# Patient Record
Sex: Female | Born: 1995 | Hispanic: Yes | Marital: Single | Smoking: Current every day smoker
Health system: Southern US, Community
[De-identification: ages and names within clinical notes are randomized; demographics above are authoritative.]

## PROBLEM LIST (undated history)

## (undated) DIAGNOSIS — F1010 Alcohol abuse, uncomplicated: Secondary | ICD-10-CM

---
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
## 2016-02-06 ENCOUNTER — Emergency Department (HOSPITAL_BASED_OUTPATIENT_CLINIC_OR_DEPARTMENT_OTHER): Payer: Self-pay

## 2016-02-06 ENCOUNTER — Emergency Department (HOSPITAL_BASED_OUTPATIENT_CLINIC_OR_DEPARTMENT_OTHER)
Admission: EM | Admit: 2016-02-06 | Discharge: 2016-02-06 | Disposition: A | Discharge status: Home / Self Care | Payer: Self-pay | Attending: Emergency Medicine | Admitting: Emergency Medicine

## 2016-02-06 ENCOUNTER — Encounter (HOSPITAL_BASED_OUTPATIENT_CLINIC_OR_DEPARTMENT_OTHER): Payer: Self-pay

## 2016-02-06 DIAGNOSIS — J209 Acute bronchitis, unspecified: Secondary | ICD-10-CM | POA: Insufficient documentation

## 2016-02-06 DIAGNOSIS — F17210 Nicotine dependence, cigarettes, uncomplicated: Secondary | ICD-10-CM | POA: Insufficient documentation

## 2016-02-06 DIAGNOSIS — J40 Bronchitis, not specified as acute or chronic: Secondary | ICD-10-CM

## 2016-02-06 LAB — RAPID STREP SCREEN (MED CTR MEBANE ONLY): STREPTOCOCCUS, GROUP A SCREEN (DIRECT): NEGATIVE

## 2016-02-06 MED ORDER — BENZONATATE 100 MG PO CAPS: 100.0000 mg | ORAL_CAPSULE | Freq: Three times a day (TID) | ORAL | Status: DC | PRN

## 2016-02-06 MED ORDER — AZITHROMYCIN 250 MG PO TABS: 250.0000 mg | ORAL_TABLET | Freq: Every day | ORAL | Status: DC

## 2016-02-06 MED ORDER — IBUPROFEN 800 MG PO TABS
800.0000 mg | ORAL_TABLET | Freq: Once | ORAL | Status: AC
  Administered 2016-02-06: 800 mg via ORAL
  Filled 2016-02-06: qty 1

## 2016-02-06 MED ORDER — IBUPROFEN 800 MG PO TABS: 800.0000 mg | ORAL_TABLET | Freq: Three times a day (TID) | ORAL | Status: DC

## 2016-02-06 NOTE — ED Provider Notes (Signed)
CSN: [id number]     Arrival date & time 02/06/16  [id number] History   First MD Initiated Contact with Patient 02/06/16 [id number]     Chief Complaint  Patient presents with  . Sore Throat  . Cough     (Consider location/radiation/quality/duration/timing/severity/associated sxs/prior Treatment) HPI   Mikayla Glover is a 20 y.o. female with no significant PMH who presents with 1 month hx of gradual onset, intermittent, worsening productive (mucous) cough.  She states a couple of days ago her throat began to feel "funny" in the mornings when waking.  She reports she has been sleeping with her mouth open due to nasal congestion.  Treatments PTA include Theraflu, DayQuil, NyQuil, and Tylenol.  Associated symptoms include SOB and mild wheezing.  Denies fever, chills, neck pain/stiffness, ear pain, hemoptysis, CP, N/V, abdominal pain.    History reviewed. No pertinent past medical history. History reviewed. No pertinent past surgical history. No family history on file. Social History  Substance Use Topics  . Smoking status: Heavy Tobacco Smoker -- 0.50 packs/day    Types: Cigarettes  . Smokeless tobacco: Never Used  . Alcohol Use: No   OB History    No data available     Review of Systems All other systems negative unless otherwise stated in HPI    Allergies  Review of patient's allergies indicates no known allergies.  Home Medications   Prior to Admission medications   Medication Sig Start Date End Date Taking? Authorizing Provider  azithromycin (ZITHROMAX) 250 MG tablet Take 1 tablet (250 mg total) by mouth daily. Take first 2 tablets together, then 1 every day until finished. 02/06/16   Cheri FowlerKayla Ariona Deschene, PA-C  benzonatate (TESSALON) 100 MG capsule Take 1 capsule (100 mg total) by mouth 3 (three) times daily as needed for cough. 02/06/16   Cheri FowlerKayla Cynde Menard, PA-C  ibuprofen (ADVIL,MOTRIN) 800 MG tablet Take 1 tablet (800 mg total) by mouth 3 (three) times daily. 02/06/16   Wilkin Lippy, PA-C   BP 134/96 mmHg   Pulse 66  Temp(Src) 99.1 F (37.3 C) (Oral)  Resp 18  Ht 5\' 5"  (1.651 m)  Wt 65.772 kg  BMI 24.13 kg/m2  SpO2 100% Physical Exam  Constitutional: She is oriented to person, place, and time. She appears well-developed and well-nourished.  Non-toxic appearance. She does not have a sickly appearance. She does not appear ill.  HENT:  Head: Normocephalic and atraumatic.  Right Ear: Tympanic membrane and external ear normal.  Left Ear: Tympanic membrane and external ear normal.  Nose: Nose normal.  Mouth/Throat: Uvula is midline and mucous membranes are normal. Oropharyngeal exudate and posterior oropharyngeal erythema present. No posterior oropharyngeal edema or tonsillar abscesses.  Eyes: Conjunctivae are normal.  Neck: Normal range of motion. Neck supple.  No nuchal rigidity.  Cardiovascular: Normal rate, regular rhythm and normal heart sounds.   No murmur heard. Pulmonary/Chest: Effort normal and breath sounds normal. No accessory muscle usage or stridor. No respiratory distress. She has no wheezes. She has no rhonchi. She has no rales.  Abdominal: Soft. Bowel sounds are normal. She exhibits no distension. There is no tenderness.  Musculoskeletal: Normal range of motion.  Lymphadenopathy:    She has no cervical adenopathy.  Neurological: She is alert and oriented to person, place, and time.  Speech clear without dysarthria.  Skin: Skin is warm and dry.  Psychiatric: She has a normal mood and affect. Her behavior is normal.    ED Course  Procedures (including critical care time) Labs Review  Labs Reviewed  RAPID STREP SCREEN (NOT AT [facility])  CULTURE, GROUP A STREP [facility])    Imaging Review Dg Chest 2 View  02/06/2016  CLINICAL DATA:  Cough congestion sore throat for 4 days, patient smokes EXAM: CHEST  2 VIEW COMPARISON:  05/23/2014 FINDINGS: The heart size and mediastinal contours are within normal limits. Both lungs are clear. The visualized skeletal structures are unremarkable.  IMPRESSION: No active cardiopulmonary disease. Electronically Signed   By: Esperanza Heir M.D.   On: 02/06/2016 09:29   I have personally reviewed and evaluated these images and lab results as part of my medical decision-making.   EKG Interpretation None      MDM   Final diagnoses:  Bronchitis    VSS, NAD.  Mild post-oropharyngeal exudates and erythema.  No peritonsillar abscess.  Uvula midline.  Remaining HENT exam unremarkable.  Lungs CTAB.  She appears non-toxic or septic. Plan to obtain rapid strep and CXR to evaluate for PNA.  Discharge home with azithromycin and tessalon perles.  Recommend tylenol and motrin for symptom control.  Follow up PCP in 2-3 days for recheck.  Discussed return precautions.  Patient agrees and acknowledges the above plan for discharge.     Cheri Fowler, PA-C 02/06/16 1019  Lavera Guise, MD 02/06/16 [phone]

## 2016-02-06 NOTE — ED Notes (Signed)
Rx x 3 given to pt a t discharge for zithromax, ibuprofen, and tessalon

## 2016-02-06 NOTE — ED Notes (Signed)
PA at bedside.

## 2016-02-06 NOTE — ED Notes (Signed)
Patient transported to X-ray 

## 2016-02-06 NOTE — ED Notes (Signed)
Patient here with sore throat, cough and congestion for the past several days, no distress

## 2016-02-06 NOTE — Discharge Instructions (Signed)

## 2016-02-08 LAB — CULTURE, GROUP A STREP (THRC)

## 2016-03-01 ENCOUNTER — Encounter (HOSPITAL_BASED_OUTPATIENT_CLINIC_OR_DEPARTMENT_OTHER): Payer: Self-pay

## 2016-03-01 ENCOUNTER — Emergency Department (HOSPITAL_BASED_OUTPATIENT_CLINIC_OR_DEPARTMENT_OTHER): Payer: Medicaid Other

## 2016-03-01 ENCOUNTER — Emergency Department (HOSPITAL_BASED_OUTPATIENT_CLINIC_OR_DEPARTMENT_OTHER)
Admission: EM | Admit: 2016-03-01 | Discharge: 2016-03-01 | Disposition: A | Discharge status: Home / Self Care | Payer: Medicaid Other | Attending: Emergency Medicine | Admitting: Emergency Medicine

## 2016-03-01 DIAGNOSIS — X58XXXA Exposure to other specified factors, initial encounter: Secondary | ICD-10-CM | POA: Insufficient documentation

## 2016-03-01 DIAGNOSIS — Y9289 Other specified places as the place of occurrence of the external cause: Secondary | ICD-10-CM | POA: Insufficient documentation

## 2016-03-01 DIAGNOSIS — Y990 Civilian activity done for income or pay: Secondary | ICD-10-CM | POA: Insufficient documentation

## 2016-03-01 DIAGNOSIS — F17210 Nicotine dependence, cigarettes, uncomplicated: Secondary | ICD-10-CM | POA: Insufficient documentation

## 2016-03-01 DIAGNOSIS — Y9389 Activity, other specified: Secondary | ICD-10-CM | POA: Insufficient documentation

## 2016-03-01 DIAGNOSIS — S39011A Strain of muscle, fascia and tendon of abdomen, initial encounter: Secondary | ICD-10-CM | POA: Insufficient documentation

## 2016-03-01 DIAGNOSIS — Z3202 Encounter for pregnancy test, result negative: Secondary | ICD-10-CM | POA: Insufficient documentation

## 2016-03-01 LAB — URINE MICROSCOPIC-ADD ON

## 2016-03-01 LAB — COMPREHENSIVE METABOLIC PANEL
ALK PHOS: 40 U/L (ref 38–126)
ALT: 12 U/L — AB (ref 14–54)
AST: 17 U/L (ref 15–41)
Albumin: 4.2 g/dL (ref 3.5–5.0)
Anion gap: 6 (ref 5–15)
BILIRUBIN TOTAL: 0.6 mg/dL (ref 0.3–1.2)
BUN: 8 mg/dL (ref 6–20)
CALCIUM: 8.6 mg/dL — AB (ref 8.9–10.3)
CO2: 24 mmol/L (ref 22–32)
CREATININE: 0.87 mg/dL (ref 0.44–1.00)
Chloride: 107 mmol/L (ref 101–111)
Glucose, Bld: 97 mg/dL (ref 65–99)
Potassium: 3.3 mmol/L — ABNORMAL LOW (ref 3.5–5.1)
Sodium: 137 mmol/L (ref 135–145)
Total Protein: 6.7 g/dL (ref 6.5–8.1)

## 2016-03-01 LAB — URINALYSIS, ROUTINE W REFLEX MICROSCOPIC
GLUCOSE, UA: NEGATIVE mg/dL
Ketones, ur: NEGATIVE mg/dL
LEUKOCYTES UA: NEGATIVE
NITRITE: NEGATIVE
PH: 6 (ref 5.0–8.0)
Protein, ur: NEGATIVE mg/dL
SPECIFIC GRAVITY, URINE: 1.026 (ref 1.005–1.030)

## 2016-03-01 LAB — CBC WITH DIFFERENTIAL/PLATELET
Basophils Absolute: 0 10*3/uL (ref 0.0–0.1)
Basophils Relative: 0 %
Eosinophils Absolute: 0.4 10*3/uL (ref 0.0–0.7)
Eosinophils Relative: 5 %
HEMATOCRIT: 32.8 % — AB (ref 36.0–46.0)
HEMOGLOBIN: 11.2 g/dL — AB (ref 12.0–15.0)
LYMPHS ABS: 1.9 10*3/uL (ref 0.7–4.0)
LYMPHS PCT: 24 %
MCH: 29.1 pg (ref 26.0–34.0)
MCHC: 34.1 g/dL (ref 30.0–36.0)
MCV: 85.2 fL (ref 78.0–100.0)
Monocytes Absolute: 0.4 10*3/uL (ref 0.1–1.0)
Monocytes Relative: 5 %
NEUTROS PCT: 66 %
Neutro Abs: 5.3 10*3/uL (ref 1.7–7.7)
Platelets: 213 10*3/uL (ref 150–400)
RBC: 3.85 MIL/uL — AB (ref 3.87–5.11)
RDW: 14.2 % (ref 11.5–15.5)
WBC: 8 10*3/uL (ref 4.0–10.5)

## 2016-03-01 LAB — PREGNANCY, URINE: Preg Test, Ur: NEGATIVE

## 2016-03-01 LAB — LIPASE, BLOOD: LIPASE: 13 U/L (ref 11–51)

## 2016-03-01 NOTE — ED Provider Notes (Signed)
CSN: [id number]     Arrival date & time 03/01/16  1110 History   First MD Initiated Contact with Patient 03/01/16 1117     Chief Complaint  Patient presents with  . Abdominal Pain     (Consider location/radiation/quality/duration/timing/severity/associated sxs/prior Treatment) HPI   Blood pressure 125/72, pulse 79, temperature 98.5 F (36.9 C), temperature source Oral, resp. rate 16, height 5\' 5"  (1.651 m), weight 63.504 kg, last menstrual period 02/26/2016, SpO2 100 %.  Mikayla Glover is a 20 y.o. female complaining of Right upper quadrant pain onset while she was cleaning a stain out of the carpet on a elevator just prior to arrival. Initially the pain was severe, 10 out of 10, it has improved to 5 out of 10 with no intervention, no pain medication. Patient denies fever, chills, nausea, vomiting, change in bowel or bladder habits, abnormal discharge. States that the pain is exacerbated by movement and certain positions. Pain is not tied to eating, states she hasn't ate anything today. On review of system patient endorses a intermittently productive cough onset several weeks ago denies chest pain, shortness of breath, history of DVTs lash PE, recent mobilizations, calf pain or leg swelling.  History reviewed. No pertinent past medical history. History reviewed. No pertinent past surgical history. No family history on file. Social History  Substance Use Topics  . Smoking status: Current Every Day Smoker -- 0.50 packs/day    Types: Cigarettes  . Smokeless tobacco: Never Used  . Alcohol Use: Yes     Comment: occ   OB History    No data available     Review of Systems  10 systems reviewed and found to be negative, except as noted in the HPI.   Allergies  Review of patient's allergies indicates no known allergies.  Home Medications   Prior to Admission medications   Not on File   BP 125/72 mmHg  Pulse 79  Temp(Src) 98.5 F (36.9 C) (Oral)  Resp 16  Ht 5\' 5"  (1.651 m)  Wt  63.504 kg  BMI 23.30 kg/m2  SpO2 100%  LMP 02/26/2016 Physical Exam  Constitutional: She is oriented to person, place, and time. She appears well-developed and well-nourished. No distress.  HENT:  Head: Normocephalic and atraumatic.  Mouth/Throat: Oropharynx is clear and moist.  Eyes: Conjunctivae and EOM are normal. Pupils are equal, round, and reactive to light.  Neck: Normal range of motion. No JVD present. No tracheal deviation present.  Cardiovascular: Normal rate, regular rhythm and intact distal pulses.   Radial pulse equal bilaterally  Pulmonary/Chest: Effort normal and breath sounds normal. No stridor. No respiratory distress. She has no wheezes. She has no rales. She exhibits no tenderness.  Abdominal: Soft. Bowel sounds are normal. She exhibits no distension and no mass. There is tenderness. There is no rebound and no guarding.  Tender in the right upper quadrant with no guarding or rebound.  Genitourinary:  No CVA tenderness to percussion bilaterally  Musculoskeletal: Normal range of motion. She exhibits no edema or tenderness.  No calf asymmetry, superficial collaterals, palpable cords, edema, Homans sign negative bilaterally.    Neurological: She is alert and oriented to person, place, and time.  Skin: Skin is warm. She is not diaphoretic.  Psychiatric: She has a normal mood and affect.  Nursing note and vitals reviewed.   ED Course  Procedures (including critical care time) Labs Review Labs Reviewed  URINALYSIS, ROUTINE W REFLEX MICROSCOPIC (NOT AT [facility]) - Abnormal; Notable for the following:  Color, Urine AMBER (*)    APPearance CLOUDY (*)    Hgb urine dipstick SMALL (*)    Bilirubin Urine SMALL (*)    All other components within normal limits  CBC WITH DIFFERENTIAL/PLATELET - Abnormal; Notable for the following:    RBC 3.85 (*)    Hemoglobin 11.2 (*)    HCT 32.8 (*)    All other components within normal limits  COMPREHENSIVE METABOLIC PANEL - Abnormal;  Notable for the following:    Potassium 3.3 (*)    Calcium 8.6 (*)    ALT 12 (*)    All other components within normal limits  URINE MICROSCOPIC-ADD ON - Abnormal; Notable for the following:    Squamous Epithelial / LPF 6-30 (*)    Bacteria, UA FEW (*)    All other components within normal limits  PREGNANCY, URINE  LIPASE, BLOOD    Imaging Review No results found. I have personally reviewed and evaluated these images and lab results as part of my medical decision-making.   EKG Interpretation None      MDM   Final diagnoses:  Abdominal muscle strain, initial encounter   Filed Vitals:   03/01/16 1116  BP: 125/72  Pulse: 79  Temp: 98.5 F (36.9 C)  TempSrc: Oral  Resp: 16  Height:  (1.651 m)  Weight: 63.504 kg  SpO2: 100%    Mikayla Glover is 20 y.o. female presenting with Acute onset of severe right upper quadrant pain. Patient declines pain medication. Based on the acuity of onset and its exacerbation by exertion and this is likely a pulled muscle. Will check basic blood work, UA. Patient does endorse a cough which she's had for several weeks. Will also check a chest x-ray. This would be a very atypical presentation of a PE.  Blood work reassuring with no significant abnormalities, mildly reduced potassium at 3.3. Advised patient to eat high potassium foods. Urinalysis is not consistent with infection. Will treat for muscle strain with NSAIDs. Repeat abdominal exam remains benign.   Evaluation does not show pathology that would require ongoing emergent intervention or inpatient treatment. Pt is hemodynamically stable and mentating appropriately. Discussed findings and plan with patient/guardian, who agrees with care plan. All questions answered. Return precautions discussed and outpatient follow up given.       Wynetta Emery, PA-C 03/01/16 1249  Zadie Rhine, MD 03/02/16 1031

## 2016-03-01 NOTE — Discharge Instructions (Signed)
For pain control please take ibuprofen (also known as Motrin or Advil) 800mg  (this is normally 4 over the counter pills) 3 times a day  for 5 days. Take with food to minimize stomach irritation.  Return to the emergency room for severely worsening abdominal pain, abdominal pain that localizes to a particular area (especially the right lower part of the belly), pain that persists past 8-10 hours, blood in stool or vomit, severe weakness, fainting, or fever.   Please obtain primary care using resource guide below. Let them know that you were seen in the emergency room and that they will need to obtain records for further outpatient management.   [employer] Resource Guide Financial Assistance The United Ways 211 is a great source of information about community services available.  Access by dialing 2-1-1 from anywhere in [place], or by website -  PooledIncome.plwww.nc211.org.   Other Local Resources (Updated 12/2015)  Financial Assistance   Services    Phone Number and Address  [facility]  Low-cost medical care - 1st and 3rd Saturday of every month  Must not qualify for public or private insurance and must have limited income [phone] [address] [place], [place]    [place] [employer] [place] [facility] of Social Services  Child care  Emergency assistance for housing and Kimberly-Clarkutilities  Food stamps  Medicaid [phone] [address] [place], [place] [address]   [facility]  Low-cost medical care for children, communicable diseases, sexually-transmitted diseases, immunizations, maternity care, womens health and family planning [phone] [address] [place], [place] [address]  [facility]   Medication assistance for [facility] residents  Must meet income requirements [phone] [address] [place], [place].    [facility] Social [facility]  Child care  Emergency  assistance for housing and Kimberly-Clarkutilities  Food stamps  Medicaid [phone] [address] [place], [place] [address]  [facility]   Low-cost medical care,   Monday through Friday, 9 am to 6 pm.   Accepts Medicare/Medicaid, and self-pay [phone] [address] [place], [place] [address]  [facility]  Low-cost medical care - Monday through Friday, 8:30 am - 5:30 pm  Accepts Medicaid and self-pay [phone] [address] [place], [place] [address]   [facility]  Primary medical care, including for those with sickle cell disease  Accepts Medicare, Medicaid, insurance and self-pay [phone] [address] [place], [place]  [facility]   Primary medical care  Accepts Medicare, [place], insurance and self-pay [phone] [address] [place], [place] [address]   [facility] Department of Social Services  Child care  Emergency assistance for housing and Kimberly-Clarkutilities  Food stamps  Medicaid [phone] [address] [place], [place] [address]  [facility] and [employer]Human Services  Child care  Emergency assistance for housing and Kimberly-Clarkutilities  Food stamps  Medicaid [phone] [address] [place], [place] [address]   [facility]  Medication assistance for [facility] residents with no insurance only  Must have a primary care doctor [phone] [address] [place], [place]  [facility]   Primary medical care  EnterpriseAccepts Medicare, [place]icaid, insurance  [phone] [address] [place], [place]  [facility]   Medication assistance [phone]  [facility]   Primary medical care  Accepts Medicare, Medicaid, insurance and self-pay  [phone] [address] [place], [place] [address]  [facility]    Primary medical care  Accepts Medicare, [place], insurance and self-pay [phone] [address] [place], [place] [address]  [facility]  For [place] residents between the ages of 47 and 78 who do not have any form of health insurance, Medicare, [place], or [place] benefits.  Services are provided free of charge to uninsured patients who fall within federal poverty guidelines.    Hours: Tuesdays and Thursdays, 4:15 - 8 pm [phone] [address] [place], [place] [address]  [facility]     Primary medical care  Dental care  Nutritional counseling  Pharmacy  Accepts Medicaid, Medicare, most insurance.  Fees are adjusted based on ability to pay.   [phone] [facility] [address] [place], [place]  [phone] [facility] [address] [place], [place]  [phone] [facility] [place], [place]  [phone] [facility], [address] [place], [place]  [phone] [facility] [address] [place], [place]  [facility]  Womens health and family planning [phone] [address] [place], [place]  [facility] Department of Social Services  Child care  Emergency assistance for housing and [employer]ark  Medicaid [phone], [place], [place] [address]   [facility]    Ages 12 and older  Hours: Mondays and Thursdays, 7:00 am - 9:00 am Patients are seen on a first come, first served basis. [phone], [phone] [place], [place]  [facility] Division of Social Services  Child care  Emergency assistance for housing and [employer]ark  Medicaid [phone] [address] [place], [place] [address]  [facility]  Medication assistance  Rental assistance  Food pantry  Medication  assistance  Housing assistance  Emergency food distribution  Utility assistance [phone] [address] [place], [place]  [phone]  [address] [place], [place] [address] Hours: Tuesdays and Thursdays from 9am - 12 noon by appointment only  [phone] [address] [place], [place] [address]  [facility]   Accepts private insurance, [place], and [place].  Payment is based on a sliding scale for those without insurance.  Hours: Mondays, Tuesdays and Thursdays, 8:30 am - 5:30 pm.   [phone] [address] [place], [place]  [facility] - Family Medicine at [facility], [place], and [place].  Payment is based on a sliding scale for those without insurance. [phone] [address] [place], [place]  [facility] - Pediatrics at E. Scientist, research (physical sciences), [employer], and [place].  Payment is based on a sliding scale for those without insurance [phone] [address], [employer], [place]  [facility] - Pediatrics at [employer], [place], and [place].  Payment is based on a sliding scale for those without insurance. [phone] [address] [place], [place]  [facility] - Pediatrics at [facility], [place], and [place].  Payment is based on a sliding scale for those without insurance. [phone], [phone] [place], [place].    [facility]  Maternity care.  Accepts Medicaid and self-pay. [phone] [address] [place], [place]

## 2016-03-01 NOTE — ED Notes (Signed)
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

## 2016-03-11 ENCOUNTER — Emergency Department (HOSPITAL_BASED_OUTPATIENT_CLINIC_OR_DEPARTMENT_OTHER)
Admission: EM | Admit: 2016-03-11 | Discharge: 2016-03-11 | Disposition: A | Discharge status: Home / Self Care | Payer: Self-pay | Attending: Emergency Medicine | Admitting: Emergency Medicine

## 2016-03-11 ENCOUNTER — Encounter (HOSPITAL_BASED_OUTPATIENT_CLINIC_OR_DEPARTMENT_OTHER): Payer: Self-pay

## 2016-03-11 ENCOUNTER — Emergency Department (HOSPITAL_BASED_OUTPATIENT_CLINIC_OR_DEPARTMENT_OTHER): Payer: Self-pay

## 2016-03-11 DIAGNOSIS — Y998 Other external cause status: Secondary | ICD-10-CM | POA: Insufficient documentation

## 2016-03-11 DIAGNOSIS — S82892A Other fracture of left lower leg, initial encounter for closed fracture: Secondary | ICD-10-CM | POA: Insufficient documentation

## 2016-03-11 DIAGNOSIS — X58XXXA Exposure to other specified factors, initial encounter: Secondary | ICD-10-CM | POA: Insufficient documentation

## 2016-03-11 DIAGNOSIS — S82842A Displaced bimalleolar fracture of left lower leg, initial encounter for closed fracture: Secondary | ICD-10-CM

## 2016-03-11 DIAGNOSIS — Y9383 Activity, rough housing and horseplay: Secondary | ICD-10-CM | POA: Insufficient documentation

## 2016-03-11 DIAGNOSIS — F17210 Nicotine dependence, cigarettes, uncomplicated: Secondary | ICD-10-CM | POA: Insufficient documentation

## 2016-03-11 DIAGNOSIS — Y9289 Other specified places as the place of occurrence of the external cause: Secondary | ICD-10-CM | POA: Insufficient documentation

## 2016-03-11 DIAGNOSIS — F1010 Alcohol abuse, uncomplicated: Secondary | ICD-10-CM | POA: Insufficient documentation

## 2016-03-11 MED ORDER — HYDROCODONE-ACETAMINOPHEN 5-325 MG PO TABS: 1.0000 | ORAL_TABLET | Freq: Four times a day (QID) | ORAL | Status: DC | PRN

## 2016-03-11 NOTE — ED Notes (Signed)
Pt verbalizes understanding of d/c instructions and denies any further needs at this time. 

## 2016-03-11 NOTE — ED Notes (Signed)
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  Left ankle is swollen and painful to touch, unable to bear weight.  Distal pulses intact, capillary refill normal.
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
## 2016-06-27 ENCOUNTER — Emergency Department (HOSPITAL_BASED_OUTPATIENT_CLINIC_OR_DEPARTMENT_OTHER)
Admission: EM | Admit: 2016-06-27 | Discharge: 2016-06-27 | Disposition: A | Discharge status: Home / Self Care | Payer: Medicaid Other | Attending: Emergency Medicine | Admitting: Emergency Medicine

## 2016-06-27 ENCOUNTER — Encounter (HOSPITAL_BASED_OUTPATIENT_CLINIC_OR_DEPARTMENT_OTHER): Payer: Self-pay

## 2016-06-27 DIAGNOSIS — F17210 Nicotine dependence, cigarettes, uncomplicated: Secondary | ICD-10-CM | POA: Insufficient documentation

## 2016-06-27 DIAGNOSIS — R21 Rash and other nonspecific skin eruption: Secondary | ICD-10-CM | POA: Insufficient documentation

## 2016-06-27 NOTE — ED Provider Notes (Signed)
[facility] Note   CSN: [id number] Arrival date & time: 06/27/16  1129  First Provider Contact:  First MD Initiated Contact with Patient 06/27/16 1238        History   Chief Complaint Chief Complaint  Patient presents with  . Rash    HPI Mikayla Glover is a 20 y.o. female.  Mikayla Glover is a 20 y.o. Female who presents to the ED complaining of a rash to her left cheek that appeared today. She reports the rash is not painful or itchy. She has no other areas on her body. She reports she has had a similar area to her leg previously but this has resolved. No easy bruising or bleeding. No known injury to this site. No sore throat or trouble swallowing. No treatments prior to arrival. She denies any other rashes. Denies fevers, fatigue, body aches, or other rashes.   The history is provided by the patient. No language interpreter was used.  Rash      History reviewed. No pertinent past medical history.  There are no active problems to display for this patient.   History reviewed. No pertinent surgical history.  OB History    No data available       Home Medications    Prior to Admission medications   Not on File    Family History No family history on file.  Social History Social History  Substance Use Topics  . Smoking status: Current Every Day Smoker    Packs/day: 0.50    Types: Cigarettes  . Smokeless tobacco: Never Used  . Alcohol use Yes     Comment: occ     Allergies   Review of patient's allergies indicates no known allergies.   Review of Systems Review of Systems  Constitutional: Negative for fatigue and fever.  HENT: Negative for mouth sores and nosebleeds.   Respiratory: Negative for shortness of breath.   Gastrointestinal: Negative for nausea and vomiting.  Genitourinary: Negative for hematuria.  Musculoskeletal: Negative for arthralgias and myalgias.  Skin: Positive for rash. Negative for pallor and wound.    Neurological: Negative for weakness and light-headedness.     Physical Exam Updated Vital Signs BP (!) 148/106 (BP Location: Right Arm)   Pulse 77   Temp 99.1 F (37.3 C) (Oral)   Resp 16   Ht 5\' 5"  (1.651 m)   Wt 57.2 kg   LMP 05/18/2016   SpO2 100%   BMI 20.97 kg/m   Physical Exam  Constitutional: She appears well-developed and well-nourished. No distress.  Nontoxic appearing.  HENT:  Head: Normocephalic and atraumatic.  Right Ear: External ear normal.  Left Ear: External ear normal.  Mouth/Throat: Oropharynx is clear and moist. No oropharyngeal exudate.  Small 1 cm area of scattered telangiectasias to her left cheek. No other areas noted. No mouth lesions. Throat clear.   Eyes: Conjunctivae are normal. Pupils are equal, round, and reactive to light. Right eye exhibits no discharge. Left eye exhibits no discharge.  Neck: Neck supple.  Cardiovascular: Normal rate, regular rhythm, normal heart sounds and intact distal pulses.   Pulmonary/Chest: Effort normal and breath sounds normal. No respiratory distress.  Abdominal: Soft. There is no tenderness.  Lymphadenopathy:    She has no cervical adenopathy.  Neurological: She is alert. Coordination normal.  Skin: Skin is warm and dry. Capillary refill takes less than 2 seconds. No rash noted. She is not diaphoretic. No erythema. No pallor.  small 1 cm area of scattered  telangiectasias to her left cheek. No other rashes noted. No vesicles or bulla. No petechiae. The area is nontender to palpation. No bruising noted.   Psychiatric: She has a normal mood and affect. Her behavior is normal.  Nursing note and vitals reviewed.    ED Treatments / Results  Labs (all labs ordered are listed, but only abnormal results are displayed) Labs Reviewed - No data to display  EKG  EKG Interpretation None       Radiology No results found.  Procedures Procedures (including critical care time)  Medications Ordered in  ED Medications - No data to display   Initial Impression / Assessment and Plan / ED Course  I have reviewed the triage vital signs and the nursing notes.  Pertinent labs & imaging results that were available during my care of the patient were reviewed by me and considered in my medical decision making (see chart for details).  Clinical Course   This is a 20 y.o. Female who presents to the ED complaining of a rash to her left cheek that appeared today. She reports the rash is not painful or itchy. She has no other areas on her body. She reports she has had a similar area to her leg previously but this has resolved. No easy bruising or bleeding. No known injury to this site. No sore throat or trouble swallowing.  On exam the patient is afebrile nontoxic appearing. She is a small 1 cm area of scattered telangiectasias to her left cheek. It is nontender to touch. No other rashes noted. No vesicles or bulla. No petechiae. No ecchymosis. I note that the patient had a CBC back in March of this year. It shows a normal white count and normal platelet count. Hemoglobin was 11.3. This is reassuring. I encouraged her to follow-up with primary care and provided follow-up with dermatology. I discussed return precautions. I advised the patient to follow-up with their primary care provider this week. I advised the patient to return to the emergency department with new or worsening symptoms or new concerns. The patient verbalized understanding and agreement with plan.     Final Clinical Impressions(s) / ED Diagnoses   Final diagnoses:  Rash and nonspecific skin eruption    New Prescriptions New Prescriptions   No medications on file     Everlene Farrier, PA-C 06/27/16 1300    Gwyneth Sprout, MD 06/27/16 2137

## 2016-06-27 NOTE — Discharge Instructions (Signed)
You have telangiectasias to your left cheek. Please follow up with primary care and dermatology. These are bursted blood vessels.

## 2016-06-27 NOTE — ED Triage Notes (Signed)
C/o scattered red areas to x 2 weeks-NAD-steady gait

## 2018-12-05 ENCOUNTER — Emergency Department (HOSPITAL_BASED_OUTPATIENT_CLINIC_OR_DEPARTMENT_OTHER)
Admission: EM | Admit: 2018-12-05 | Discharge: 2018-12-05 | Disposition: A | Discharge status: Home / Self Care | Payer: Self-pay | Attending: Emergency Medicine | Admitting: Emergency Medicine

## 2018-12-05 ENCOUNTER — Encounter (HOSPITAL_BASED_OUTPATIENT_CLINIC_OR_DEPARTMENT_OTHER): Payer: Self-pay | Admitting: Emergency Medicine

## 2018-12-05 ENCOUNTER — Other Ambulatory Visit: Payer: Self-pay

## 2018-12-05 DIAGNOSIS — F1020 Alcohol dependence, uncomplicated: Secondary | ICD-10-CM | POA: Insufficient documentation

## 2018-12-05 DIAGNOSIS — F1029 Alcohol dependence with unspecified alcohol-induced disorder: Secondary | ICD-10-CM

## 2018-12-05 HISTORY — DX: Alcohol abuse, uncomplicated: F10.10

## 2018-12-05 LAB — RAPID URINE DRUG SCREEN, HOSP PERFORMED
Amphetamines: POSITIVE — AB
BENZODIAZEPINES: NOT DETECTED
Barbiturates: NOT DETECTED
Cocaine: NOT DETECTED
Opiates: NOT DETECTED
Tetrahydrocannabinol: POSITIVE — AB

## 2018-12-05 LAB — COMPREHENSIVE METABOLIC PANEL
ALT: 23 U/L (ref 0–44)
ANION GAP: 12 (ref 5–15)
AST: 27 U/L (ref 15–41)
Albumin: 4.7 g/dL (ref 3.5–5.0)
Alkaline Phosphatase: 44 U/L (ref 38–126)
BUN: 8 mg/dL (ref 6–20)
CO2: 20 mmol/L — ABNORMAL LOW (ref 22–32)
Calcium: 8.7 mg/dL — ABNORMAL LOW (ref 8.9–10.3)
Chloride: 107 mmol/L (ref 98–111)
Creatinine, Ser: 0.75 mg/dL (ref 0.44–1.00)
GFR calc Af Amer: >60 mL/min (ref >60)
GFR calc non Af Amer: >60 mL/min (ref >60)
Glucose, Bld: 79 mg/dL (ref 70–99)
Potassium: 3.6 mmol/L (ref 3.5–5.1)
Sodium: 139 mmol/L (ref 135–145)
Total Bilirubin: 0.7 mg/dL (ref 0.3–1.2)
Total Protein: 7.8 g/dL (ref 6.5–8.1)

## 2018-12-05 LAB — URINALYSIS, ROUTINE W REFLEX MICROSCOPIC
Bilirubin Urine: NEGATIVE
GLUCOSE, UA: NEGATIVE mg/dL
Ketones, ur: NEGATIVE mg/dL
Nitrite: NEGATIVE
Protein, ur: NEGATIVE mg/dL
Specific Gravity, Urine: 1.025 (ref 1.005–1.030)
pH: 5.5 (ref 5.0–8.0)

## 2018-12-05 LAB — CBC
HCT: 40.2 % (ref 36.0–46.0)
HEMOGLOBIN: 13 g/dL (ref 12.0–15.0)
MCH: 29.3 pg (ref 26.0–34.0)
MCHC: 32.3 g/dL (ref 30.0–36.0)
MCV: 90.5 fL (ref 80.0–100.0)
Platelets: 264 10*3/uL (ref 150–400)
RBC: 4.44 MIL/uL (ref 3.87–5.11)
RDW: 13.6 % (ref 11.5–15.5)
WBC: 9.6 10*3/uL (ref 4.0–10.5)
nRBC: 0 % (ref 0.0–0.2)

## 2018-12-05 LAB — ETHANOL: ALCOHOL ETHYL (B): 135 mg/dL — AB (ref <10)

## 2018-12-05 LAB — URINALYSIS, MICROSCOPIC (REFLEX)

## 2018-12-05 LAB — PREGNANCY, URINE: Preg Test, Ur: NEGATIVE

## 2018-12-05 LAB — LIPASE, BLOOD: LIPASE: 20 U/L (ref 11–51)

## 2018-12-05 MED ORDER — ONDANSETRON HCL 4 MG/2ML IJ SOLN
4.0000 mg | Freq: Once | INTRAMUSCULAR | Status: AC
  Administered 2018-12-05: 4 mg via INTRAVENOUS
  Filled 2018-12-05: qty 2

## 2018-12-05 MED ORDER — CHLORDIAZEPOXIDE HCL 25 MG PO CAPS: ORAL_CAPSULE | ORAL | 0 refills | Status: DC

## 2018-12-05 MED ORDER — SODIUM CHLORIDE 0.9 % IV BOLUS
1000.0000 mL | Freq: Once | INTRAVENOUS | Status: AC
  Administered 2018-12-05: 1000 mL via INTRAVENOUS

## 2018-12-05 MED FILL — CHLORDIAZEPOXIDE 25 MG CAP: 25 | 3 days supply | Qty: 10 | Fill #0
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
## 2018-12-05 NOTE — ED Triage Notes (Signed)
Friend states has been drinking alcohol for 3 days straight, last drink was 3 beers at 0300. States has been "shaking" for 30 min, none noted at present . Just wants to sure is OK

## 2019-01-29 ENCOUNTER — Emergency Department (HOSPITAL_BASED_OUTPATIENT_CLINIC_OR_DEPARTMENT_OTHER)
Admission: EM | Admit: 2019-01-29 | Discharge: 2019-01-29 | Discharge status: Against Medical Advice | Payer: Self-pay | Attending: Emergency Medicine | Admitting: Emergency Medicine

## 2019-01-29 ENCOUNTER — Other Ambulatory Visit: Payer: Self-pay

## 2019-01-29 ENCOUNTER — Encounter (HOSPITAL_BASED_OUTPATIENT_CLINIC_OR_DEPARTMENT_OTHER): Payer: Self-pay

## 2019-01-29 DIAGNOSIS — Z5321 Procedure and treatment not carried out due to patient leaving prior to being seen by health care provider: Secondary | ICD-10-CM | POA: Insufficient documentation

## 2019-01-29 DIAGNOSIS — Y929 Unspecified place or not applicable: Secondary | ICD-10-CM | POA: Insufficient documentation

## 2019-01-29 DIAGNOSIS — Y999 Unspecified external cause status: Secondary | ICD-10-CM | POA: Insufficient documentation

## 2019-01-29 DIAGNOSIS — W19XXXA Unspecified fall, initial encounter: Secondary | ICD-10-CM | POA: Insufficient documentation

## 2019-01-29 DIAGNOSIS — Y939 Activity, unspecified: Secondary | ICD-10-CM | POA: Insufficient documentation

## 2019-01-29 DIAGNOSIS — S4991XA Unspecified injury of right shoulder and upper arm, initial encounter: Secondary | ICD-10-CM | POA: Insufficient documentation

## 2019-01-29 NOTE — ED Triage Notes (Signed)
Pt states she fell last night-injured right shoulder-seen at [facility] ED earlier with negative xray in Epic-NAD-steady gait

## 2019-01-29 NOTE — ED Notes (Signed)
Pt seen walking out .  

## 2020-01-04 ENCOUNTER — Other Ambulatory Visit: Payer: Self-pay

## 2020-01-04 ENCOUNTER — Encounter (HOSPITAL_BASED_OUTPATIENT_CLINIC_OR_DEPARTMENT_OTHER): Payer: Self-pay | Admitting: Emergency Medicine

## 2020-01-04 ENCOUNTER — Emergency Department (HOSPITAL_BASED_OUTPATIENT_CLINIC_OR_DEPARTMENT_OTHER)
Admission: EM | Admit: 2020-01-04 | Discharge: 2020-01-04 | Disposition: A | Discharge status: Home / Self Care | Payer: Self-pay | Attending: Emergency Medicine | Admitting: Emergency Medicine

## 2020-01-04 ENCOUNTER — Emergency Department (HOSPITAL_BASED_OUTPATIENT_CLINIC_OR_DEPARTMENT_OTHER): Payer: Self-pay

## 2020-01-04 DIAGNOSIS — S62357A Nondisplaced fracture of shaft of fifth metacarpal bone, left hand, initial encounter for closed fracture: Secondary | ICD-10-CM | POA: Insufficient documentation

## 2020-01-04 DIAGNOSIS — Y999 Unspecified external cause status: Secondary | ICD-10-CM | POA: Insufficient documentation

## 2020-01-04 DIAGNOSIS — Y9389 Activity, other specified: Secondary | ICD-10-CM | POA: Insufficient documentation

## 2020-01-04 DIAGNOSIS — Z79899 Other long term (current) drug therapy: Secondary | ICD-10-CM | POA: Insufficient documentation

## 2020-01-04 DIAGNOSIS — F17210 Nicotine dependence, cigarettes, uncomplicated: Secondary | ICD-10-CM | POA: Insufficient documentation

## 2020-01-04 DIAGNOSIS — W06XXXA Fall from bed, initial encounter: Secondary | ICD-10-CM | POA: Insufficient documentation

## 2020-01-04 DIAGNOSIS — Y92003 Bedroom of unspecified non-institutional (private) residence as the place of occurrence of the external cause: Secondary | ICD-10-CM | POA: Insufficient documentation

## 2020-01-04 MED ORDER — NAPROXEN 250 MG PO TABS
500.0000 mg | ORAL_TABLET | Freq: Once | ORAL | Status: AC
  Administered 2020-01-04: 500 mg via ORAL
  Filled 2020-01-04: qty 2

## 2020-01-04 MED ORDER — NAPROXEN 500 MG PO TABS: 500.0000 mg | ORAL_TABLET | Freq: Two times a day (BID) | ORAL | 0 refills | Status: DC

## 2020-01-04 NOTE — ED Triage Notes (Signed)
States she got up during the night and tripped and her left  5th finger may have got bent back. Pain to left,  5th finger, swelling noted to outer aspect of hand

## 2020-01-04 NOTE — Discharge Instructions (Signed)
Your x-ray shows that you have a slight fracture through the bone, this will need to be splinted and possibly casted.  I have referred you to a hand specialist in [place], please call the office for an appointment, take naproxen twice daily for pain, keep the hand elevated ice and use the splint until you see the specialist

## 2020-01-04 NOTE — ED Notes (Signed)
Pt ambulated to XR.
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
## 2020-01-28 ENCOUNTER — Encounter (HOSPITAL_BASED_OUTPATIENT_CLINIC_OR_DEPARTMENT_OTHER): Payer: Self-pay | Admitting: Emergency Medicine

## 2020-01-28 ENCOUNTER — Other Ambulatory Visit: Payer: Self-pay

## 2020-01-28 ENCOUNTER — Emergency Department (HOSPITAL_BASED_OUTPATIENT_CLINIC_OR_DEPARTMENT_OTHER): Payer: BC Managed Care – PPO

## 2020-01-28 ENCOUNTER — Emergency Department (HOSPITAL_BASED_OUTPATIENT_CLINIC_OR_DEPARTMENT_OTHER)
Admission: EM | Admit: 2020-01-28 | Discharge: 2020-01-28 | Disposition: A | Discharge status: Home / Self Care | Payer: BC Managed Care – PPO | Attending: Emergency Medicine | Admitting: Emergency Medicine

## 2020-01-28 DIAGNOSIS — Y9289 Other specified places as the place of occurrence of the external cause: Secondary | ICD-10-CM | POA: Insufficient documentation

## 2020-01-28 DIAGNOSIS — Y999 Unspecified external cause status: Secondary | ICD-10-CM | POA: Insufficient documentation

## 2020-01-28 DIAGNOSIS — F1010 Alcohol abuse, uncomplicated: Secondary | ICD-10-CM | POA: Insufficient documentation

## 2020-01-28 DIAGNOSIS — X58XXXA Exposure to other specified factors, initial encounter: Secondary | ICD-10-CM | POA: Insufficient documentation

## 2020-01-28 DIAGNOSIS — S62357G Nondisplaced fracture of shaft of fifth metacarpal bone, left hand, subsequent encounter for fracture with delayed healing: Secondary | ICD-10-CM

## 2020-01-28 DIAGNOSIS — Y9389 Activity, other specified: Secondary | ICD-10-CM | POA: Insufficient documentation

## 2020-01-28 DIAGNOSIS — F17210 Nicotine dependence, cigarettes, uncomplicated: Secondary | ICD-10-CM | POA: Insufficient documentation

## 2020-01-28 DIAGNOSIS — Z79899 Other long term (current) drug therapy: Secondary | ICD-10-CM | POA: Insufficient documentation

## 2020-01-28 DIAGNOSIS — S62357A Nondisplaced fracture of shaft of fifth metacarpal bone, left hand, initial encounter for closed fracture: Secondary | ICD-10-CM | POA: Insufficient documentation
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
## 2020-01-28 NOTE — Discharge Instructions (Signed)
Fracture care There is evidence of a fracture on the x-ray. Pain:  Antiinflammatory medications: Take 600 mg of ibuprofen every 6 hours or 440 mg (over the counter dose) to 500 mg (prescription dose) of naproxen every 12 hours for the next 3 days. After this time, these medications may be used as needed for pain. Take these medications with food to avoid upset stomach. Choose only one of these medications, do not take them together. Acetaminophen (generic for Tylenol): Should you continue to have additional pain while taking the ibuprofen or naproxen, you may add in acetaminophen as needed. Your daily total maximum amount of acetaminophen from all sources should be limited to 4000mg /day for persons without liver problems, or 2000mg /day for those with liver problems. Ice: May apply ice to the injured area for no more than 15 minutes at a time to reduce swelling and pain. Elevation: Keep the extremity elevated whenever possible to reduce swelling and pain. Splint: Keep the splint clean and dry.  Protect it from water during bathing.  If the splint gets wet, you will need to have it reapplied.  Do not leave a wet splint against the skin as this can cause skin breakdown.  Call the orthopedist office or come to the ED for splint replacement, if needed.  Follow-up: Follow-up with the orthopedic specialist on March 10, as planned. Return: Return to the emergency department for severely increased pain, numbness, blanching of the skin, or any other major concerns.
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
## 2020-02-10 DIAGNOSIS — S62329A Displaced fracture of shaft of unspecified metacarpal bone, initial encounter for closed fracture: Secondary | ICD-10-CM | POA: Insufficient documentation

## 2020-07-28 DIAGNOSIS — M2140 Flat foot [pes planus] (acquired), unspecified foot: Secondary | ICD-10-CM | POA: Insufficient documentation

## 2020-07-28 DIAGNOSIS — J309 Allergic rhinitis, unspecified: Secondary | ICD-10-CM | POA: Insufficient documentation

## 2020-07-28 DIAGNOSIS — R229 Localized swelling, mass and lump, unspecified: Secondary | ICD-10-CM | POA: Insufficient documentation

## 2020-07-28 DIAGNOSIS — J329 Chronic sinusitis, unspecified: Secondary | ICD-10-CM | POA: Insufficient documentation

## 2020-07-28 DIAGNOSIS — B351 Tinea unguium: Secondary | ICD-10-CM | POA: Insufficient documentation

## 2021-01-11 ENCOUNTER — Other Ambulatory Visit: Payer: Self-pay

## 2021-01-11 ENCOUNTER — Other Ambulatory Visit (HOSPITAL_BASED_OUTPATIENT_CLINIC_OR_DEPARTMENT_OTHER): Payer: Self-pay | Admitting: Emergency Medicine

## 2021-01-11 ENCOUNTER — Emergency Department (HOSPITAL_BASED_OUTPATIENT_CLINIC_OR_DEPARTMENT_OTHER)
Admission: EM | Admit: 2021-01-11 | Discharge: 2021-01-11 | Disposition: A | Discharge status: Home / Self Care | Payer: No Typology Code available for payment source | Attending: Emergency Medicine | Admitting: Emergency Medicine

## 2021-01-11 ENCOUNTER — Encounter (HOSPITAL_BASED_OUTPATIENT_CLINIC_OR_DEPARTMENT_OTHER): Payer: Self-pay | Admitting: *Deleted

## 2021-01-11 DIAGNOSIS — Y92410 Unspecified street and highway as the place of occurrence of the external cause: Secondary | ICD-10-CM | POA: Diagnosis not present

## 2021-01-11 DIAGNOSIS — M546 Pain in thoracic spine: Secondary | ICD-10-CM | POA: Insufficient documentation

## 2021-01-11 DIAGNOSIS — M542 Cervicalgia: Secondary | ICD-10-CM | POA: Insufficient documentation

## 2021-01-11 DIAGNOSIS — F17210 Nicotine dependence, cigarettes, uncomplicated: Secondary | ICD-10-CM | POA: Diagnosis not present

## 2021-01-11 MED ORDER — METHOCARBAMOL 500 MG PO TABS
500.0000 mg | ORAL_TABLET | Freq: Once | ORAL | Status: AC
  Administered 2021-01-11: 500 mg via ORAL
  Filled 2021-01-11: qty 1

## 2021-01-11 MED ORDER — KETOROLAC TROMETHAMINE 30 MG/ML IJ SOLN
30.0000 mg | Freq: Once | INTRAMUSCULAR | Status: AC
  Administered 2021-01-11: 30 mg via INTRAMUSCULAR
  Filled 2021-01-11: qty 1

## 2021-01-11 MED ORDER — METHOCARBAMOL 500 MG PO TABS: 500.0000 mg | ORAL_TABLET | Freq: Three times a day (TID) | ORAL | 0 refills | Status: DC | PRN

## 2021-01-11 MED FILL — METHOCARBAMOL 500 MG TABLET: 500 | 6 days supply | Qty: 20 | Fill #0
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
## 2022-05-23 ENCOUNTER — Emergency Department (HOSPITAL_BASED_OUTPATIENT_CLINIC_OR_DEPARTMENT_OTHER)
Admission: EM | Admit: 2022-05-23 | Discharge: 2022-05-23 | Disposition: A | Discharge status: Home / Self Care | Payer: Self-pay | Attending: Emergency Medicine | Admitting: Emergency Medicine

## 2022-05-23 ENCOUNTER — Encounter (HOSPITAL_BASED_OUTPATIENT_CLINIC_OR_DEPARTMENT_OTHER): Payer: Self-pay | Admitting: Emergency Medicine

## 2022-05-23 ENCOUNTER — Other Ambulatory Visit: Payer: Self-pay

## 2022-05-23 DIAGNOSIS — Z20822 Contact with and (suspected) exposure to covid-19: Secondary | ICD-10-CM | POA: Insufficient documentation

## 2022-05-23 DIAGNOSIS — J069 Acute upper respiratory infection, unspecified: Secondary | ICD-10-CM | POA: Insufficient documentation

## 2022-05-23 LAB — RESP PANEL BY RT-PCR (FLU A&B, COVID) ARPGX2
Influenza A by PCR: NEGATIVE
Influenza B by PCR: NEGATIVE
SARS Coronavirus 2 by RT PCR: NEGATIVE

## 2022-05-23 MED ORDER — ACETAMINOPHEN 500 MG PO TABS
1000.0000 mg | ORAL_TABLET | Freq: Four times a day (QID) | ORAL | Status: DC | PRN
  Administered 2022-05-23: 1000 mg via ORAL
  Filled 2022-05-23: qty 2

## 2022-05-23 NOTE — ED Triage Notes (Signed)
Pt states she thinks she has a sinus infection  Pt is c/o sinus headache, congestion and cough  Sxs started 5 days ago

## 2022-05-23 NOTE — ED Notes (Signed)
Patient verbalizes understanding of discharge instructions. Opportunity for questioning and answers were provided. Armband removed by staff, pt discharged from ED. Ambulated out to lobby  

## 2022-05-23 NOTE — Discharge Instructions (Signed)
Please return to the ED with any new or worsening signs or symptoms Please follow-up with PCP for ongoing management Please read attached informational guide concerning upper respiratory infections as well as how to perform a sinus rinse Please treat your symptoms at home with Tylenol for headaches and fever.  You may use a Nettie pot to rinse out your sinuses.  You may also purchase over-the-counter decongestants to alleviate any decongestion he may be experiencing. If you are still experiencing the symptoms after 10 days, please return to this ED for further management.

## 2022-05-23 NOTE — ED Provider Notes (Cosign Needed)
[facility] EMERGENCY DEPARTMENT Provider Note   CSN: [id number] Arrival date & time: 05/23/22  2152     History  Chief Complaint  Patient presents with   Facial Pain    Jaculin Fuston is a 26 y.o. female with history of ETOH abuse.  The patient presents ED for evaluation of sinus pressure, congestion, sore throat, headache, body aches and chills.  Patient states that the symptoms began 5 days ago.  The patient denies being vaccinated against COVID-19 or influenza.  Patient does have positive sick contacts in the form of a sister and girlfriend.  Patient denies any fevers, nausea, vomiting, diarrhea, abdominal pain, lightheadedness, dizziness, weakness.  Patient states she often gets recurrent sinus infections.  HPI     Home Medications Prior to Admission medications   Medication Sig Start Date End Date Taking? Authorizing Provider  chlordiazePOXIDE (LIBRIUM) 25 MG capsule 50mg  PO TID x 1D, then 25-50mg  PO BID X 1D, then 25-50mg  PO QD X 1D 12/05/18   02/03/19, MD  naproxen (NAPROSYN) 500 MG tablet Take 1 tablet (500 mg total) by mouth 2 (two) times daily with a meal. 01/04/20   01/06/20, MD      Allergies    Patient has no known allergies.    Review of Systems   Review of Systems  Constitutional:  Positive for chills. Negative for fever.  HENT:  Positive for sinus pressure, sinus pain and sore throat.   Respiratory:  Positive for cough and chest tightness.   Gastrointestinal:  Negative for abdominal pain, diarrhea, nausea and vomiting.  Neurological:  Positive for headaches. Negative for dizziness, weakness and light-headedness.  All other systems reviewed and are negative.   Physical Exam Updated Vital Signs BP 124/89 (BP Location: Left Arm)   Pulse 99   Temp 99.2 F (37.3 C) (Oral)   Resp 18   Ht 5\' 5"  (1.651 m)   Wt 86.2 kg   LMP 05/13/2022 (Exact Date)   SpO2 100%   BMI 31.62 kg/m  Physical Exam Vitals and nursing note reviewed.   Constitutional:      General: She is not in acute distress.    Appearance: She is not ill-appearing, toxic-appearing or diaphoretic.  HENT:     Head: Normocephalic and atraumatic.     Comments: Bilateral maxillary sinus tenderness on palpation    Nose: Nose normal. No congestion.     Mouth/Throat:     Mouth: Mucous membranes are moist.     Pharynx: Posterior oropharyngeal erythema present.  Eyes:     Extraocular Movements: Extraocular movements intact.     Conjunctiva/sclera: Conjunctivae normal.     Pupils: Pupils are equal, round, and reactive to light.  Cardiovascular:     Rate and Rhythm: Normal rate and regular rhythm.  Pulmonary:     Effort: Pulmonary effort is normal.     Breath sounds: Normal breath sounds. No wheezing.  Abdominal:     General: Abdomen is flat. Bowel sounds are normal.     Palpations: Abdomen is soft.     Tenderness: There is no abdominal tenderness.  Musculoskeletal:     Cervical back: Normal range of motion and neck supple. No tenderness.  Skin:    General: Skin is warm and dry.     Capillary Refill: Capillary refill takes less than 2 seconds.  Neurological:     Mental Status: She is alert and oriented to person, place, and time.     ED Results / Procedures /  Treatments   Labs (all labs ordered are listed, but only abnormal results are displayed) Labs Reviewed  RESP PANEL BY RT-PCR (FLU A&B, COVID) [id number]    EKG None  Radiology No results found.  Procedures Procedures   Medications Ordered in ED Medications  acetaminophen (TYLENOL) tablet 1,000 mg (1,000 mg Oral Given 05/23/22 2240)    ED Course/ Medical Decision Making/ A&P                           Medical Decision Making Risk OTC drugs.   26 year old female presents to ED for evaluation.  Please see HPI for further details.  On examination, the patient is afebrile and nontachycardic.  The patient lung sounds are clear bilaterally, he is not hypoxic on room air.  The  patient's abdomen is soft and compressible in all 4 quadrants.  The patient is neurologically intact.  Patient posterior oropharynx is erythematous however there is no exudate.  Patient is handling secretions appropriately.  No drooling, no trismus.  Patient worked up utilizing the following labs interpreted by me personally: - Respiratory panel negative for COVID, influenza  Patient given 1 g of Tylenol.  At this time, the patient has been counseled on how to symptomatically care for his sinus pain at home.  This is includes decongestants as well as sinus rinses.  The patient was encouraged to follow back up with his PCP or return to this ED if after 10 days he has no resolution of his symptoms.  The patient was counseled on sinus infections most likely being a viral if they have only been present for less than 10 days.  The patient voiced understanding with this.  Patient given return precautions and she voiced understanding.  The patient had all of her questions answered to her satisfaction.  The patient is stable this time for discharge home   Final Clinical Impression(s) / ED Diagnoses Final diagnoses:  Viral URI with cough    Rx / DC Orders ED Discharge Orders     None         Al Decant, PA-C 05/23/22 2335

## 2022-06-11 ENCOUNTER — Emergency Department (HOSPITAL_BASED_OUTPATIENT_CLINIC_OR_DEPARTMENT_OTHER): Payer: Self-pay

## 2022-06-11 ENCOUNTER — Emergency Department (HOSPITAL_BASED_OUTPATIENT_CLINIC_OR_DEPARTMENT_OTHER)
Admission: EM | Admit: 2022-06-11 | Discharge: 2022-06-11 | Disposition: A | Discharge status: Home / Self Care | Payer: Self-pay | Attending: Emergency Medicine | Admitting: Emergency Medicine

## 2022-06-11 ENCOUNTER — Encounter (HOSPITAL_BASED_OUTPATIENT_CLINIC_OR_DEPARTMENT_OTHER): Payer: Self-pay | Admitting: Emergency Medicine

## 2022-06-11 ENCOUNTER — Other Ambulatory Visit: Payer: Self-pay

## 2022-06-11 DIAGNOSIS — W25XXXA Contact with sharp glass, initial encounter: Secondary | ICD-10-CM | POA: Insufficient documentation

## 2022-06-11 DIAGNOSIS — S61011A Laceration without foreign body of right thumb without damage to nail, initial encounter: Secondary | ICD-10-CM | POA: Insufficient documentation

## 2022-06-11 MED ORDER — LIDOCAINE HCL (PF) 1 % IJ SOLN
10.0000 mL | Freq: Once | INTRAMUSCULAR | Status: AC
  Administered 2022-06-11: 10 mL
  Filled 2022-06-11: qty 10

## 2022-06-11 NOTE — Discharge Instructions (Signed)
Please return in 7 to 10 days for suture removal.  Please monitor for signs of redness, swelling, or abnormal drainage from the site as these could indicate an underlying infection and you will need antibiotics for this.

## 2022-06-11 NOTE — ED Triage Notes (Signed)
Pt arrives pov, steady gait, c/o lac to right thumb and left index finger, cut on bowl. Bleeding  controlled.

## 2022-06-11 NOTE — ED Provider Notes (Signed)
[facility] EMERGENCY DEPARTMENT Provider Note   CSN: [id number] Arrival date & time: 06/11/22  1459     History PMH: ETOH abuse Chief Complaint  Patient presents with   Extremity Laceration    Mikayla Glover is a 26 y.o. female. Presents after cutting her right thumb on glass bowl when she reached in the cabinet.  This happened about 1 hour prior to arrival.  She says she has had her tetanus shot back in January.  She denies any numbness or tingling of the end of her thumb.  Bleeding is controlled.  HPI     Home Medications Prior to Admission medications   Medication Sig Start Date End Date Taking? Authorizing Provider  chlordiazePOXIDE (LIBRIUM) 25 MG capsule 50mg  PO TID x 1D, then 25-50mg  PO BID X 1D, then 25-50mg  PO QD X 1D 12/05/18   02/03/19, MD  naproxen (NAPROSYN) 500 MG tablet Take 1 tablet (500 mg total) by mouth 2 (two) times daily with a meal. 01/04/20   01/06/20, MD      Allergies    Patient has no known allergies.    Review of Systems   Review of Systems  Skin:  Positive for wound.  All other systems reviewed and are negative.   Physical Exam Updated Vital Signs BP (!) 148/105 (BP Location: Left Arm)   Pulse (!) 101   Temp 99.9 F (37.7 C) (Oral)   Resp 18   Ht 5\' 5"  (1.651 m)   Wt 86.2 kg   LMP 06/11/2022 (Exact Date)   SpO2 99%   BMI 31.62 kg/m  Physical Exam Vitals and nursing note reviewed.  Constitutional:      General: She is not in acute distress.    Appearance: Normal appearance. She is well-developed. She is not ill-appearing, toxic-appearing or diaphoretic.  HENT:     Head: Normocephalic and atraumatic.     Nose: No nasal deformity.     Mouth/Throat:     Lips: Pink. No lesions.  Eyes:     General: Gaze aligned appropriately. No scleral icterus.       Right eye: No discharge.        Left eye: No discharge.     Conjunctiva/sclera: Conjunctivae normal.     Right eye: Right conjunctiva is not injected. No exudate  or hemorrhage.    Left eye: Left conjunctiva is not injected. No exudate or hemorrhage. Pulmonary:     Effort: Pulmonary effort is normal. No respiratory distress.  Musculoskeletal:     Comments: Right thumb with no obvious swelling or deformity. Able to range right 1st metatarsal PIP joint with ext and flex without difficulty. Capillary refill intact. Sensation intact distally.  Skin:    General: Skin is warm and dry.     Comments: Jagged laceration on palmar aspect of the right thumb overlying the PIP joint line.  UTA depth at this time. Bleeding is controlled.   Neurological:     Mental Status: She is alert and oriented to person, place, and time.  Psychiatric:        Mood and Affect: Mood normal.        Speech: Speech normal.        Behavior: Behavior normal. Behavior is cooperative.     ED Results / Procedures / Treatments   Labs (all labs ordered are listed, but only abnormal results are displayed) Labs Reviewed - No data to display  EKG None  Radiology DG Finger Thumb Right  Result  Date: 06/11/2022 CLINICAL DATA:  Laceration to the right thumb from a glass bowl today. EXAM: RIGHT THUMB 2+V COMPARISON:  None Available. FINDINGS: No fracture.  No bone lesion. Joints are normally spaced and aligned. Subtle soft tissue laceration.  No radiopaque foreign body. IMPRESSION: No fracture, dislocation or radiopaque foreign body Electronically Signed   By: Amie Portland M.D.   On: 06/11/2022 15:48    Procedures .Nerve Block  Date/Time: 06/11/2022 5:19 PM  Performed by: Claudie Leach, PA-C Authorized by: Claudie Leach, PA-C   Consent:    Consent obtained:  Verbal   Consent given by:  Patient   Risks, benefits, and alternatives were discussed: yes     Risks discussed:  Unsuccessful block, swelling, pain, nerve damage, allergic reaction, intravenous injection, infection and bleeding   Alternatives discussed:  No treatment, delayed treatment and alternative  treatment Universal protocol:    Procedure explained and questions answered to patient or proxy's satisfaction: yes     Patient identity confirmed:  Verbally with patient Indications:    Indications:  Procedural anesthesia and pain relief Location:    Body area:  Upper extremity   Upper extremity nerve:  Metacarpal   Laterality:  Right Pre-procedure details:    Skin preparation:  Povidone-iodine Skin anesthesia:    Skin anesthesia method:  None Procedure details:    Block needle gauge:  25 G   Anesthetic injected:  Lidocaine 1% w/o epi   Steroid injected:  None   Additive injected:  None   Injection procedure:  Anatomic landmarks identified, incremental injection, negative aspiration for blood, anatomic landmarks palpated and introduced needle   Paresthesia:  Immediately resolved Post-procedure details:    Dressing:  None   Outcome:  Anesthesia achieved   Procedure completion:  Tolerated .Marland KitchenLaceration Repair  Date/Time: 06/11/2022 5:20 PM  Performed by: Claudie Leach, PA-C Authorized by: Claudie Leach, PA-C   Consent:    Consent obtained:  Verbal   Consent given by:  Patient   Risks, benefits, and alternatives were discussed: yes     Risks discussed:  Infection, need for additional repair, nerve damage, poor wound healing, poor cosmetic result, pain, retained foreign body, tendon damage and vascular damage   Alternatives discussed:  No treatment, observation and delayed treatment Universal protocol:    Procedure explained and questions answered to patient or proxy's satisfaction: yes     Patient identity confirmed:  Verbally with patient Anesthesia:    Anesthesia method:  Local infiltration   Local anesthetic:  Lidocaine 1% w/o epi Laceration details:    Location:  Finger   Finger location:  R thumb   Length (cm):  1 Pre-procedure details:    Preparation:  Patient was prepped and draped in usual sterile fashion and imaging obtained to evaluate for foreign  bodies Exploration:    Limited defect created (wound extended): no     Hemostasis achieved with:  Direct pressure   Imaging obtained: x-ray     Imaging outcome: foreign body not noted     Wound exploration: wound explored through full range of motion and entire depth of wound visualized     Wound extent: no areolar tissue violation noted, no fascia violation noted, no foreign bodies/material noted, no muscle damage noted, no nerve damage noted, no tendon damage noted, no underlying fracture noted and no vascular damage noted     Contaminated: no   Treatment:    Area cleansed with:  Saline   Amount of cleaning:  Standard   Irrigation solution:  Sterile saline   Irrigation volume:  500   Irrigation method:  Syringe and pressure wash   Debridement:  None   Undermining:  None   Scar revision: no   Skin repair:    Repair method:  Sutures   Suture size:  6-0   Suture material:  Prolene   Suture technique:  Simple interrupted   Number of sutures:  6 Approximation:    Approximation:  Close Repair type:    Repair type:  Simple Post-procedure details:    Dressing:  Open (no dressing)   Procedure completion:  Tolerated    Medications Ordered in ED Medications  lidocaine (PF) (XYLOCAINE) 1 % injection 10 mL (10 mLs Infiltration Given by Other 06/11/22 1619)    ED Course/ Medical Decision Making/ A&P Clinical Course as of 06/11/22 1719  Sun Jun 11, 2022  1646 Nerve block in place [GL]    Clinical Course User Index [GL] Victorino Dike Finis Bud, PA-C                           Medical Decision Making Amount and/or Complexity of Data Reviewed Radiology: ordered.  Risk Prescription drug management.   Patient presents with a laceration to her right thumb.  She is up-to-date on her tetanus shot.  We obtained an x-ray and there is no evidence of foreign body or underlying fracture.  Laceration was evaluated and it does not appear to be very deep.  No tendon involvement.  Suture repair was  initiated.  Please see procedure note above.  Patient was given discharge instructions as well as return in 7 to 10 days for suture removal. Return precautions for sx of infection   Final Clinical Impression(s) / ED Diagnoses Final diagnoses:  Laceration of right thumb without foreign body without damage to nail, initial encounter    Rx / DC Orders ED Discharge Orders     None         Claudie Leach, PA-C 06/11/22 1722    Milagros Loll, MD 06/11/22 [id number]

## 2023-07-14 ENCOUNTER — Encounter (HOSPITAL_COMMUNITY): Payer: Self-pay

## 2023-07-14 ENCOUNTER — Other Ambulatory Visit: Payer: Self-pay

## 2023-07-14 ENCOUNTER — Inpatient Hospital Stay (HOSPITAL_COMMUNITY)
Admission: EM | Admit: 2023-07-14 | Discharge: 2023-07-16 | DRG: 918 | Disposition: A | Discharge status: Other Acute Inpatient Hospital | Payer: Medicaid Other | Attending: Internal Medicine | Admitting: Internal Medicine

## 2023-07-14 DIAGNOSIS — F10129 Alcohol abuse with intoxication, unspecified: Secondary | ICD-10-CM

## 2023-07-14 DIAGNOSIS — F17210 Nicotine dependence, cigarettes, uncomplicated: Secondary | ICD-10-CM | POA: Diagnosis present

## 2023-07-14 DIAGNOSIS — Y907 Blood alcohol level of 200-239 mg/100 ml: Secondary | ICD-10-CM | POA: Diagnosis present

## 2023-07-14 DIAGNOSIS — Z791 Long term (current) use of non-steroidal anti-inflammatories (NSAID): Secondary | ICD-10-CM

## 2023-07-14 DIAGNOSIS — Z6830 Body mass index (BMI) 30.0-30.9, adult: Secondary | ICD-10-CM

## 2023-07-14 DIAGNOSIS — Z5941 Food insecurity: Secondary | ICD-10-CM

## 2023-07-14 DIAGNOSIS — Z781 Physical restraint status: Secondary | ICD-10-CM | POA: Diagnosis not present

## 2023-07-14 DIAGNOSIS — Z5982 Transportation insecurity: Secondary | ICD-10-CM | POA: Diagnosis not present

## 2023-07-14 DIAGNOSIS — F919 Conduct disorder, unspecified: Secondary | ICD-10-CM | POA: Diagnosis present

## 2023-07-14 DIAGNOSIS — T391X1A Poisoning by 4-Aminophenol derivatives, accidental (unintentional), initial encounter: Secondary | ICD-10-CM | POA: Diagnosis present

## 2023-07-14 DIAGNOSIS — F419 Anxiety disorder, unspecified: Secondary | ICD-10-CM | POA: Diagnosis present

## 2023-07-14 DIAGNOSIS — E669 Obesity, unspecified: Secondary | ICD-10-CM | POA: Diagnosis present

## 2023-07-14 DIAGNOSIS — F32A Depression, unspecified: Secondary | ICD-10-CM | POA: Diagnosis present

## 2023-07-14 DIAGNOSIS — T391X4A Poisoning by 4-Aminophenol derivatives, undetermined, initial encounter: Principal | ICD-10-CM

## 2023-07-14 DIAGNOSIS — T391X2A Poisoning by 4-Aminophenol derivatives, intentional self-harm, initial encounter: Principal | ICD-10-CM | POA: Diagnosis present

## 2023-07-14 DIAGNOSIS — R451 Restlessness and agitation: Secondary | ICD-10-CM | POA: Diagnosis present

## 2023-07-14 DIAGNOSIS — F332 Major depressive disorder, recurrent severe without psychotic features: Secondary | ICD-10-CM | POA: Diagnosis not present

## 2023-07-14 DIAGNOSIS — T50902A Poisoning by unspecified drugs, medicaments and biological substances, intentional self-harm, initial encounter: Secondary | ICD-10-CM | POA: Diagnosis present

## 2023-07-14 LAB — PROTIME-INR
INR: 1.2 (ref 0.8–1.2)
Prothrombin Time: 15 seconds (ref 11.4–15.2)

## 2023-07-14 LAB — ACETAMINOPHEN LEVEL
Acetaminophen (Tylenol), Serum: 193 ug/mL (ref 10–30)
Acetaminophen (Tylenol), Serum: 154 ug/mL (ref 10–30)

## 2023-07-14 LAB — RAPID URINE DRUG SCREEN, HOSP PERFORMED
Amphetamines: NOT DETECTED
Barbiturates: NOT DETECTED
Benzodiazepines: NOT DETECTED
Cocaine: NOT DETECTED
Opiates: NOT DETECTED
Tetrahydrocannabinol: POSITIVE — AB

## 2023-07-14 LAB — CBC WITH DIFFERENTIAL/PLATELET
Abs Immature Granulocytes: 0.03 10*3/uL (ref 0.00–0.07)
Basophils Absolute: 0.1 10*3/uL (ref 0.0–0.1)
Basophils Relative: 1 %
Eosinophils Absolute: 0.2 10*3/uL (ref 0.0–0.5)
Eosinophils Relative: 2 %
HCT: 39.3 % (ref 36.0–46.0)
Hemoglobin: 12.8 g/dL (ref 12.0–15.0)
Immature Granulocytes: 0 %
Lymphocytes Relative: 38 %
Lymphs Abs: 3.6 10*3/uL (ref 0.7–4.0)
MCH: 28.8 pg (ref 26.0–34.0)
MCHC: 32.6 g/dL (ref 30.0–36.0)
MCV: 88.5 fL (ref 80.0–100.0)
Monocytes Absolute: 0.5 10*3/uL (ref 0.1–1.0)
Monocytes Relative: 5 %
Neutro Abs: 5.1 10*3/uL (ref 1.7–7.7)
Neutrophils Relative %: 54 %
Platelets: 273 10*3/uL (ref 150–400)
RBC: 4.44 MIL/uL (ref 3.87–5.11)
RDW: 14.6 % (ref 11.5–15.5)
WBC: 9.4 10*3/uL (ref 4.0–10.5)
nRBC: 0 % (ref 0.0–0.2)

## 2023-07-14 LAB — COMPREHENSIVE METABOLIC PANEL
ALT: 23 U/L (ref 0–44)
ALT: 21 U/L (ref 0–44)
AST: 23 U/L (ref 15–41)
AST: 20 U/L (ref 15–41)
Albumin: 4.6 g/dL (ref 3.5–5.0)
Albumin: 4.2 g/dL (ref 3.5–5.0)
Alkaline Phosphatase: 53 U/L (ref 38–126)
Alkaline Phosphatase: 47 U/L (ref 38–126)
Anion gap: 12 (ref 5–15)
Anion gap: 15 (ref 5–15)
BUN: 7 mg/dL (ref 6–20)
BUN: 8 mg/dL (ref 6–20)
CO2: 25 mmol/L (ref 22–32)
CO2: 22 mmol/L (ref 22–32)
Calcium: 9.1 mg/dL (ref 8.9–10.3)
Calcium: 8.7 mg/dL — ABNORMAL LOW (ref 8.9–10.3)
Chloride: 100 mmol/L (ref 98–111)
Chloride: 103 mmol/L (ref 98–111)
Creatinine, Ser: 0.89 mg/dL (ref 0.44–1.00)
Creatinine, Ser: 0.76 mg/dL (ref 0.44–1.00)
GFR, Estimated: >60 mL/min (ref >60)
GFR, Estimated: >60 mL/min (ref >60)
Glucose, Bld: 98 mg/dL (ref 70–99)
Glucose, Bld: 102 mg/dL — ABNORMAL HIGH (ref 70–99)
Potassium: 3.5 mmol/L (ref 3.5–5.1)
Potassium: 3.6 mmol/L (ref 3.5–5.1)
Sodium: 137 mmol/L (ref 135–145)
Sodium: 140 mmol/L (ref 135–145)
Total Bilirubin: 0.4 mg/dL (ref 0.3–1.2)
Total Bilirubin: 0.6 mg/dL (ref 0.3–1.2)
Total Protein: 8 g/dL (ref 6.5–8.1)
Total Protein: 7.7 g/dL (ref 6.5–8.1)

## 2023-07-14 LAB — HIV ANTIBODY (ROUTINE TESTING W REFLEX): HIV Screen 4th Generation wRfx: NONREACTIVE

## 2023-07-14 LAB — ETHANOL: Alcohol, Ethyl (B): 285 mg/dL — ABNORMAL HIGH (ref <10)

## 2023-07-14 LAB — HCG, SERUM, QUALITATIVE: Preg, Serum: NEGATIVE

## 2023-07-14 LAB — MRSA NEXT GEN BY PCR, NASAL: MRSA by PCR Next Gen: NOT DETECTED

## 2023-07-14 LAB — SALICYLATE LEVEL: Salicylate Lvl: <7 mg/dL — ABNORMAL LOW (ref 7.0–30.0)

## 2023-07-14 LAB — MAGNESIUM: Magnesium: 2.1 mg/dL (ref 1.7–2.4)

## 2023-07-14 LAB — PHOSPHORUS: Phosphorus: 4.4 mg/dL (ref 2.5–4.6)

## 2023-07-14 MED ORDER — POTASSIUM CHLORIDE IN NACL 20-0.9 MEQ/L-% IV SOLN
INTRAVENOUS | Status: DC
  Filled 2023-07-14 (×4): qty 1000

## 2023-07-14 MED ORDER — LORAZEPAM 2 MG/ML IJ SOLN: 0.0000 mg | Freq: Three times a day (TID) | INTRAMUSCULAR | Status: DC

## 2023-07-14 MED ORDER — CHLORHEXIDINE GLUCONATE CLOTH 2 % EX PADS
6.0000 | MEDICATED_PAD | Freq: Every day | CUTANEOUS | Status: DC
  Administered 2023-07-14 – 2023-07-16 (×3): 6 via TOPICAL

## 2023-07-14 MED ORDER — LORAZEPAM 1 MG PO TABS: 1.0000 mg | ORAL_TABLET | ORAL | Status: DC | PRN

## 2023-07-14 MED ORDER — FOLIC ACID 1 MG PO TABS
1.0000 mg | ORAL_TABLET | Freq: Every day | ORAL | Status: DC
  Administered 2023-07-15 – 2023-07-16 (×2): 1 mg via ORAL
  Filled 2023-07-14 (×2): qty 1

## 2023-07-14 MED ORDER — LORAZEPAM 2 MG/ML IJ SOLN
2.0000 mg | Freq: Once | INTRAMUSCULAR | Status: AC
Start: 2023-07-14 — End: 2023-07-14
  Administered 2023-07-14: 2 mg via INTRAVENOUS
  Filled 2023-07-14: qty 1

## 2023-07-14 MED ORDER — ACETYLCYSTEINE LOAD VIA INFUSION
150.0000 mg/kg | Freq: Once | INTRAVENOUS | Status: AC
Start: 2023-07-14 — End: 2023-07-14
  Administered 2023-07-14: 12420 mg via INTRAVENOUS
  Filled 2023-07-14: qty 408

## 2023-07-14 MED ORDER — LORAZEPAM 2 MG/ML IJ SOLN
0.0000 mg | INTRAMUSCULAR | Status: AC
  Administered 2023-07-14 (×2): 2 mg via INTRAVENOUS
  Administered 2023-07-14: 4 mg via INTRAVENOUS
  Filled 2023-07-14 (×2): qty 1
  Filled 2023-07-14: qty 2

## 2023-07-14 MED ORDER — MAGNESIUM SULFATE 2 GM/50ML IV SOLN
2.0000 g | Freq: Once | INTRAVENOUS | Status: AC
  Administered 2023-07-14: 2 g via INTRAVENOUS
  Filled 2023-07-14: qty 50

## 2023-07-14 MED ORDER — ORAL CARE MOUTH RINSE: 15.0000 mL | OROMUCOSAL | Status: DC | PRN

## 2023-07-14 MED ORDER — THIAMINE HCL 100 MG/ML IJ SOLN
100.0000 mg | Freq: Every day | INTRAMUSCULAR | Status: DC
  Administered 2023-07-14: 100 mg via INTRAVENOUS
  Filled 2023-07-14: qty 2

## 2023-07-14 MED ORDER — DEXTROSE 5 % IV SOLN
15.0000 mg/kg/h | INTRAVENOUS | Status: DC
  Administered 2023-07-14 (×3): 15 mg/kg/h via INTRAVENOUS
  Filled 2023-07-14 (×4): qty 90

## 2023-07-14 MED ORDER — ONDANSETRON HCL 4 MG PO TABS: 4.0000 mg | ORAL_TABLET | Freq: Four times a day (QID) | ORAL | Status: DC | PRN

## 2023-07-14 MED ORDER — THIAMINE MONONITRATE 100 MG PO TABS
100.0000 mg | ORAL_TABLET | Freq: Every day | ORAL | Status: DC
  Administered 2023-07-15 – 2023-07-16 (×2): 100 mg via ORAL
  Filled 2023-07-14 (×2): qty 1

## 2023-07-14 MED ORDER — NICOTINE 14 MG/24HR TD PT24
14.0000 mg | MEDICATED_PATCH | Freq: Every day | TRANSDERMAL | Status: DC
  Administered 2023-07-14 – 2023-07-16 (×3): 14 mg via TRANSDERMAL
  Filled 2023-07-14 (×3): qty 1

## 2023-07-14 MED ORDER — LORAZEPAM 2 MG/ML IJ SOLN: 1.0000 mg | INTRAMUSCULAR | Status: DC | PRN

## 2023-07-14 MED ORDER — ONDANSETRON HCL 4 MG/2ML IJ SOLN: 4.0000 mg | Freq: Four times a day (QID) | INTRAMUSCULAR | Status: DC | PRN

## 2023-07-14 MED ORDER — ADULT MULTIVITAMIN W/MINERALS CH
1.0000 | ORAL_TABLET | Freq: Every day | ORAL | Status: DC
  Administered 2023-07-15 – 2023-07-16 (×2): 1 via ORAL
  Filled 2023-07-14 (×2): qty 1

## 2023-07-14 MED ORDER — LORAZEPAM 2 MG/ML IJ SOLN
2.0000 mg | Freq: Once | INTRAMUSCULAR | Status: AC
  Administered 2023-07-14: 2 mg via INTRAVENOUS
  Filled 2023-07-14: qty 1

## 2023-07-14 NOTE — ED Notes (Signed)
Pt is currently sleeping, will obtain urine when she awakes.

## 2023-07-14 NOTE — H&P (Signed)
History and Physical    Patient: Mikayla Glover EPP:[id number] DOB: 1996-03-03 DOA: 07/14/2023 DOS: the patient was seen and examined on 07/14/2023 PCP: Pcp, No  Patient coming from: Home  Chief Complaint:  Chief Complaint  Patient presents with   Drug Overdose   HPI: Mikayla Glover is a 27 y.o. female with medical history significant of EtOH abuse who presented to the emergency department due to acetaminophen overdose with suicidal purposes and alcohol intoxication.  She received lorazepam 2 mg IVP x 2 in the emergency department, is currently sedated and unable to provide further information at this moment.  Lab work: Her CBC and CMP were normal.  Serum pregnancy test is negative.  Acetaminophen level was 193 mcg/mL.  Alcohol was 285 and salicylate less than 7.0 mg/dL.  UDS was positive for THC.   ED course: Initial vital signs were attempted 98.1 F, HR 84, respiration 20, BP 139/90 mmHg O2 sat 100% on room air.  The patient was started on Mucomyst and received lorazepam 2 mg IVP x 2.  Review of Systems: As mentioned in the history of present illness. All other systems reviewed and are negative.  Past Medical History:  Diagnosis Date   ETOH abuse    started drinking at age 54-15    History reviewed. No pertinent surgical history. Social History:  reports that she has been smoking cigarettes and cigars. She has never used smokeless tobacco. She reports current alcohol use. She reports that she does not currently use drugs after having used the following drugs: Marijuana.  No Known Allergies  History reviewed. No pertinent family history.  Prior to Admission medications   Medication Sig Start Date End Date Taking? Authorizing Provider  chlordiazePOXIDE (LIBRIUM) 25 MG capsule 50mg  PO TID x 1D, then 25-50mg  PO BID X 1D, then 25-50mg  PO QD X 1D 12/05/18   Alvira Monday, MD  naproxen (NAPROSYN) 500 MG tablet Take 1 tablet (500 mg total) by mouth 2 (two) times daily with a meal. 01/04/20    Eber Hong, MD    Physical Exam: Vitals:   07/14/23 0410 07/14/23 0430 07/14/23 0535 07/14/23 0608  BP:  109/61 108/76   Pulse:   81   Resp:  (!) 22 (!) 21   Temp:    97.7 F (36.5 C)  TempSrc:    Axillary  SpO2:  100% 100%   Weight: 82.8 kg      Physical Exam Vitals and nursing note reviewed.  Constitutional:      General: She is sleeping. She is not in acute distress.    Appearance: Normal appearance.  HENT:     Head: Normocephalic.     Nose: No rhinorrhea.     Mouth/Throat:     Mouth: Mucous membranes are moist.  Eyes:     General: No scleral icterus.    Pupils: Pupils are equal, round, and reactive to light.  Neck:     Vascular: No JVD.  Cardiovascular:     Rate and Rhythm: Normal rate and regular rhythm.     Heart sounds: S1 normal and S2 normal.  Pulmonary:     Effort: Pulmonary effort is normal.     Breath sounds: Normal breath sounds. No wheezing, rhonchi or rales.  Abdominal:     General: Bowel sounds are normal. There is no distension.     Palpations: Abdomen is soft.     Tenderness: There is no abdominal tenderness.  Musculoskeletal:     Cervical back: Neck supple.  Right lower leg: No edema.     Left lower leg: No edema.  Skin:    General: Skin is warm and dry.  Neurological:     General: No focal deficit present.     Mental Status: She is easily aroused.     Data Reviewed:  Results are pending, will review when available.  Assessment and Plan: Principal Problem:   Acetaminophen overdose Observation/SDU. Continue IV fluids. Continue Mucomyst per pharmacy. Follow-up acetaminophen level. Follow-up LFTs and PT/INR. Follow CBC, CMP and lipase in AM. Consult psychiatry when the patient is more alert.  Active Problems:   Alcohol abuse with intoxication (HCC) CIWA protocol with lorazepam. Magnesium sulfate supplementation. Folate, MVI and thiamine. Consult TOC team.    Advance Care Planning:   Code Status: Full Code   Consults:    Family Communication:   Severity of Illness: The appropriate patient status for this patient is INPATIENT. Inpatient status is judged to be reasonable and necessary in order to provide the required intensity of service to ensure the patient's safety. The patient's presenting symptoms, physical exam findings, and initial radiographic and laboratory data in the context of their chronic comorbidities is felt to place them at high risk for further clinical deterioration. Furthermore, it is not anticipated that the patient will be medically stable for discharge from the hospital within 2 midnights of admission.   * I certify that at the point of admission it is my clinical judgment that the patient will require inpatient hospital care spanning beyond 2 midnights from the point of admission due to high intensity of service, high risk for further deterioration and high frequency of surveillance required.*  Author: Bobette Mo, MD 07/14/2023 7:44 AM  For on call review [email or web address].   This document was prepared using Dragon voice recognition software and may contain some unintended transcription errors.
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
## 2023-07-14 NOTE — ED Notes (Signed)
Pt significant other Tresa Garter [phone] and pt mother Ellin Mayhew [phone] requests contact from pt and nurse for pt status/updates when possible. [employer]
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
## 2023-07-14 NOTE — Plan of Care (Signed)
  Problem: Clinical Measurements: Goal: Diagnostic test results will improve Outcome: Progressing   

## 2023-07-14 NOTE — Consult Note (Signed)
[facility] Face-to-Face Psychiatry Consult   Reason for Consult:  Tylenol Overdose Referring Physician:  Dr. Robb Matar Patient Identification: Mikayla Glover MRN:  [id number] Principal Diagnosis: Acetaminophen overdose Diagnosis:  Principal Problem:   Acetaminophen overdose Active Problems:   Alcohol abuse with intoxication (HCC)   Total Time spent with patient: 1 hour  Subjective:   Mikayla Glover is a 27 y.o. female patient admitted with  Chief Complaint  Patient presents with   Drug Overdose   .  HPI:   Per Primary Team: Mikayla Glover is a 27 y.o. female with medical history significant of EtOH abuse who presented to the emergency department due to acetaminophen overdose with suicidal purposes and alcohol intoxication.  She received lorazepam 2 mg IVP x 2 in the emergency department, is currently sedated and unable to provide further information at this moment.   On Interview: Patient seen laying in bed in restraints accompanied by sitter and nurse at bedside. The patient was able to report her name before falling back asleep. Per nursing the patient has been sleeping most of the shift and when she does become aroused she is aggressive with staff. Interview terminated.  Risk to Self:   Risk to Others:   Prior Inpatient Therapy:   Prior Outpatient Therapy:    Past Medical History:  Past Medical History:  Diagnosis Date   ETOH abuse    started drinking at age 9-15    History reviewed. No pertinent surgical history. Family History: History reviewed. No pertinent family history. Family Psychiatric  History:  Social History:  Social History   Substance and Sexual Activity  Alcohol Use Yes   Comment: weekends     Social History   Substance and Sexual Activity  Drug Use Not Currently   Types: Marijuana    Social History   Socioeconomic History   Marital status: Single    Spouse name: Not on file   Number of children: Not on file   Years of education: Not on file   Highest  education level: Not on file  Occupational History   Not on file  Tobacco Use   Smoking status: Every Day    Current packs/day: 0.50    Types: Cigarettes, Cigars   Smokeless tobacco: Never  Vaping Use   Vaping status: Never Used  Substance and Sexual Activity   Alcohol use: Yes    Comment: weekends   Drug use: Not Currently    Types: Marijuana   Sexual activity: Not on file  Other Topics Concern   Not on file  Social History Narrative   Not on file   Social Determinants of Health   Financial Resource Strain: Not on file  Food Insecurity: Not on file  Transportation Needs: Not on file  Physical Activity: Not on file  Stress: Not on file  Social Connections: Not on file   Additional Social History:    Allergies:  No Known Allergies  Labs:  Results for orders placed or performed during the hospital encounter of 07/14/23 (from the past 48 hour(s))  CBC with Differential     Status: None   Collection Time: 07/14/23  1:43 AM  Result Value Ref Range   WBC 9.4 4.0 - 10.5 K/uL   RBC 4.44 3.87 - 5.11 MIL/uL   Hemoglobin 12.8 12.0 - 15.0 g/dL   HCT 02.7 25.3 - 66.4 %   MCV 88.5 80.0 - 100.0 fL   MCH 28.8 26.0 - 34.0 pg   MCHC 32.6 30.0 - 36.0 g/dL  RDW 14.6 11.5 - 15.5 %   Platelets 273 150 - 400 K/uL   nRBC 0.0 0.0 - 0.2 %   Neutrophils Relative % 54 %   Neutro Abs 5.1 1.7 - 7.7 K/uL   Lymphocytes Relative 38 %   Lymphs Abs 3.6 0.7 - 4.0 K/uL   Monocytes Relative 5 %   Monocytes Absolute 0.5 0.1 - 1.0 K/uL   Eosinophils Relative 2 %   Eosinophils Absolute 0.2 0.0 - 0.5 K/uL   Basophils Relative 1 %   Basophils Absolute 0.1 0.0 - 0.1 K/uL   Immature Granulocytes 0 %   Abs Immature Granulocytes 0.03 0.00 - 0.07 K/uL    Comment: Performed at [facility], [address], [place], [place] [address]  Comprehensive metabolic panel     Status: None   Collection Time: 07/14/23  1:43 AM  Result Value Ref Range   Sodium 137 135 - 145 mmol/L    Potassium 3.5 3.5 - 5.1 mmol/L   Chloride 100 98 - 111 mmol/L   CO2 25 22 - 32 mmol/L   Glucose, Bld 98 70 - 99 mg/dL    Comment: Glucose reference range applies only to samples taken after fasting for at least 8 hours.   BUN 7 6 - 20 mg/dL   Creatinine, Ser 4.09 0.44 - 1.00 mg/dL   Calcium 9.1 8.9 - 81.1 mg/dL   Total Protein 8.0 6.5 - 8.1 g/dL   Albumin 4.6 3.5 - 5.0 g/dL   AST 23 15 - 41 U/L   ALT 23 0 - 44 U/L   Alkaline Phosphatase 53 38 - 126 U/L   Total Bilirubin 0.4 0.3 - 1.2 mg/dL   GFR, Estimated >91 >47 mL/min    Comment: (NOTE) Calculated using the CKD-EPI Creatinine Equation (2021)    Anion gap 12 5 - 15    Comment: Performed at [facility], [address], [place], [place] [address]  Ethanol     Status: Abnormal   Collection Time: 07/14/23  1:43 AM  Result Value Ref Range   Alcohol, Ethyl (B) 285 (H) <10 mg/dL    Comment: (NOTE) Lowest detectable limit for serum alcohol is 10 mg/dL.  For medical purposes only. Performed at [facility], [address] [place], [place] [address]   Salicylate level     Status: Abnormal   Collection Time: 07/14/23  1:43 AM  Result Value Ref Range   Salicylate Lvl <7.0 (L) 7.0 - 30.0 mg/dL    Comment: Performed at [facility], [address], [place], [place] [address]  Acetaminophen level     Status: Abnormal   Collection Time: 07/14/23  1:43 AM  Result Value Ref Range   Acetaminophen (Tylenol), Serum 193 (HH) 10 - 30 ug/mL    Comment: CRITICAL RESULT CALLED TO, READ BACK BY AND VERIFIED WITH JOHNSON,J AT 0350 ON 07/14/23 BY LUZOLOP (NOTE) Therapeutic concentrations vary significantly. A range of 10-30 ug/mL  may be an effective concentration for many patients. However, some  are best treated at concentrations outside of this range. Acetaminophen concentrations >150 ug/mL at 4 hours after ingestion  and >50 ug/mL at 12 hours after ingestion are often associated with   toxic reactions.  Performed at [facility], [address] [place], [place] [address]   hCG, serum, qualitative     Status: None   Collection Time: 07/14/23  1:43 AM  Result Value Ref Range   Preg,  Serum NEGATIVE NEGATIVE    Comment:        THE SENSITIVITY OF THIS METHODOLOGY IS >10 mIU/mL. Performed at [facility], [address] [place], [place] [address]   Rapid urine drug screen (hospital performed)     Status: Abnormal   Collection Time: 07/14/23  5:05 AM  Result Value Ref Range   Opiates NONE DETECTED NONE DETECTED   Cocaine NONE DETECTED NONE DETECTED   Benzodiazepines NONE DETECTED NONE DETECTED   Amphetamines NONE DETECTED NONE DETECTED   Tetrahydrocannabinol POSITIVE (A) NONE DETECTED   Barbiturates NONE DETECTED NONE DETECTED    Comment: (NOTE) DRUG SCREEN FOR MEDICAL PURPOSES ONLY.  IF CONFIRMATION IS NEEDED FOR ANY PURPOSE, NOTIFY LAB WITHIN 5 DAYS.  LOWEST DETECTABLE LIMITS FOR URINE DRUG SCREEN Drug Class                     Cutoff (ng/mL) Amphetamine and metabolites    1000 Barbiturate and metabolites    200 Benzodiazepine                 200 Opiates and metabolites        300 Cocaine and metabolites        300 THC                            50 Performed at [facility], [address] [place], [place] [address]   MRSA Next Gen by PCR, Nasal     Status: None   Collection Time: 07/14/23  9:26 AM   Specimen: Nasal Mucosa; Nasal Swab  Result Value Ref Range   MRSA by PCR Next Gen NOT DETECTED NOT DETECTED    Comment: (NOTE) The GeneXpert MRSA Assay (FDA approved for NASAL specimens only), is one component of a comprehensive MRSA colonization surveillance program. It is not intended to diagnose MRSA infection nor to guide or monitor treatment for MRSA infections. Test performance is not FDA approved in patients less than 68 years old. Performed at [facility], [address], [place], [place] [address]   Comprehensive metabolic panel     Status: Abnormal   Collection Time: 07/14/23 10:24 AM  Result Value Ref Range   Sodium 140 135 - 145 mmol/L   Potassium 3.6 3.5 - 5.1 mmol/L   Chloride 103 98 - 111 mmol/L   CO2 22 22 - 32 mmol/L   Glucose, Bld 102 (H) 70 - 99 mg/dL    Comment: Glucose reference range applies only to samples taken after fasting for at least 8 hours.   BUN 8 6 - 20 mg/dL   Creatinine, Ser 9.14 0.44 - 1.00 mg/dL   Calcium 8.7 (L) 8.9 - 10.3 mg/dL   Total Protein 7.7 6.5 - 8.1 g/dL   Albumin 4.2 3.5 - 5.0 g/dL   AST 20 15 - 41 U/L   ALT 21 0 - 44 U/L   Alkaline Phosphatase 47 38 - 126 U/L   Total Bilirubin 0.6 0.3 - 1.2 mg/dL   GFR, Estimated >78 >29 mL/min    Comment: (NOTE) Calculated using the CKD-EPI Creatinine Equation (2021)    Anion gap 15 5 - 15    Comment: Performed at [facility], [address] [place], [place] [address]  Protime-INR     Status: None   Collection Time: 07/14/23 10:24 AM  Result Value Ref Range   Prothrombin Time  15.0 11.4 - 15.2 seconds   INR 1.2 0.8 - 1.2    Comment: (NOTE) INR goal varies based on device and disease states. Performed at [facility], [address], [place], [place] [address]   Magnesium     Status: None   Collection Time: 07/14/23 10:24 AM  Result Value Ref Range   Magnesium 2.1 1.7 - 2.4 mg/dL    Comment: Performed at [facility], [address] [place], [place] [address]  Phosphorus     Status: None   Collection Time: 07/14/23 10:24 AM  Result Value Ref Range   Phosphorus 4.4 2.5 - 4.6 mg/dL    Comment: Performed at [facility], [address], [place], [place] [address]  Acetaminophen level     Status: Abnormal   Collection Time: 07/14/23  1:01 PM  Result Value Ref Range   Acetaminophen (Tylenol), Serum 154 (HH) 10 - 30 ug/mL    Comment: CRITICAL RESULT CALLED TO, READ BACK BY AND  VERIFIED WITH WOODS, K RN @ 1356 ON 07/14/2023 BY XIONG, K (NOTE) Therapeutic concentrations vary significantly. A range of 10-30 ug/mL  may be an effective concentration for many patients. However, some  are best treated at concentrations outside of this range. Acetaminophen concentrations >150 ug/mL at 4 hours after ingestion  and >50 ug/mL at 12 hours after ingestion are often associated with  toxic reactions.  Performed at [facility], [address], [place], [place] [address]     Current Facility-Administered Medications  Medication Dose Route Frequency Provider Last Rate Last Admin   0.9 % NaCl with KCl 20 mEq/ L  infusion   Intravenous Continuous Bobette Mo, MD 125 mL/hr at 07/14/23 0945 New Bag at 07/14/23 0945   acetylcysteine (ACETADOTE) 18,000 mg in dextrose 5 % 590 mL (30.5085 mg/mL) infusion  15 mg/kg/hr Intravenous Continuous Garlon Hatchet, PA-C 40.7 mL/hr at 07/14/23 1235 15 mg/kg/hr at 07/14/23 1235   Chlorhexidine Gluconate Cloth 2 % PADS 6 each  6 each Topical Daily Bobette Mo, MD   6 each at 07/14/23 2956   folic acid (FOLVITE) tablet 1 mg  1 mg Oral Daily Bobette Mo, MD       LORazepam (ATIVAN) injection 0-4 mg  0-4 mg Intravenous Q4H Bobette Mo, MD   2 mg at 07/14/23 1305   Followed by   Melene Muller ON 07/16/2023] LORazepam (ATIVAN) injection 0-4 mg  0-4 mg Intravenous Q8H Bobette Mo, MD       LORazepam (ATIVAN) tablet 1-4 mg  1-4 mg Oral Q1H PRN Bobette Mo, MD       Or   LORazepam (ATIVAN) injection 1-4 mg  1-4 mg Intravenous Q1H PRN Bobette Mo, MD       magnesium sulfate IVPB 2 g 50 mL  2 g Intravenous Once Bobette Mo, MD 50 mL/hr at 07/14/23 1438 2 g at 07/14/23 1438   multivitamin with minerals tablet 1 tablet  1 tablet Oral Daily Bobette Mo, MD       ondansetron [facility]) tablet 4 mg  4 mg Oral Q6H PRN Bobette Mo, MD       Or   ondansetron [facility])  injection 4 mg  4 mg Intravenous Q6H PRN Bobette Mo, MD       Oral care mouth rinse  15 mL Mouth Rinse PRN Bobette Mo, MD       thiamine (VITAMIN  B1) tablet 100 mg  100 mg Oral Daily Bobette Mo, MD       Or   thiamine (VITAMIN B1) injection 100 mg  100 mg Intravenous Daily Bobette Mo, MD   100 mg at 07/14/23 1610    Psychiatric Specialty Exam:  Presentation  General Appearance:  Disheveled  Eye Contact: Poor  Speech: Slurred  Speech Volume: Decreased  Handedness:No data recorded  Mood and Affect  Mood: -- (UTA)  Affect: Labile   Thought Process  Thought Processes: -- (UTA)  Descriptions of Associations:No data recorded Orientation:-- (UTA)  Thought Content:-- (UTA)  History of Schizophrenia/Schizoaffective disorder:No data recorded Duration of Psychotic Symptoms:No data recorded Hallucinations:No data recorded Ideas of Reference:No data recorded Suicidal Thoughts:Suicidal Thoughts: Yes, Active  Homicidal Thoughts:No data recorded  Sensorium  Memory:No data recorded Judgment: Poor  Insight: Poor   Executive Functions  Concentration:No data recorded Attention Span:No data recorded Recall:No data recorded Fund of Knowledge:No data recorded Language:No data recorded  Psychomotor Activity  Psychomotor Activity:No data recorded  Assets  Assets:No data recorded  Sleep  Sleep:No data recorded  Physical Exam: Physical Exam ROS Blood pressure (!) 129/92, pulse 92, temperature (!) 96.9 F (36.1 C), temperature source Axillary, resp. rate (!) 21, weight 82.8 kg, SpO2 100%. Body mass index is 30.38 kg/m.  Treatment Plan Summary: Daily contact with patient to assess and evaluate symptoms and progress in treatment  Alcohol Use Disorder -Agree with CIWA protocol and Ativan with withdrawal parameters  Disposition: Recommend psychiatric Inpatient admission when medically cleared.  Harlin Heys,  DO 07/14/2023 3:12 PM

## 2023-07-14 NOTE — ED Triage Notes (Signed)
Pt BIB EMS from home for aggressive behavior. Pt was banging on walls and started vomiting. Pt's mom saw an acetaminophen bottle with the vomit. Per EMS, the medication was 500mg  each and there was around 10 pills present in the vomit.  114/80 80 hr 100% RA 16 rr 104 cbg

## 2023-07-14 NOTE — Progress Notes (Signed)
MEDICATION RELATED CONSULT NOTE  Pharmacy Consult for acetylcysteine Indication: acetaminophen overdose  No Known Allergies  Patient Measurements: Weight: 82.8 kg (182 lb 8.7 oz)  Vital Signs: Temp: 97.7 F (36.5 C) (08/10 0608) Temp Source: Axillary (08/10 0608) BP: 108/76 (08/10 0535) Pulse Rate: 81 (08/10 0535) Intake/Output from previous day: No intake/output data recorded. Intake/Output from this shift: No intake/output data recorded.  Labs: Recent Labs    07/14/23 0143  WBC 9.4  HGB 12.8  HCT 39.3  PLT 273  CREATININE 0.89  ALBUMIN 4.6  PROT 8.0  AST 23  ALT 23  ALKPHOS 53  BILITOT 0.4   CrCl cannot be calculated (Unknown ideal weight.).   Microbiology: No results found for this or any previous visit (from the past 720 hour(s)).  Medical History: Past Medical History:  Diagnosis Date   ETOH abuse    started drinking at age 36-15      Assessment: 27 year old female presented after behavioral episode per mother. Patient was found to have vomited and EMS reports approximately 10 tablets of Tylenol 500 mg present in the emesis. Suspected ingestion around midnight. APAP level drawn ~ 0145 at 193.   EDP starting acetylcysteine.   Plan:  -Acetylcysteine 150 mg/kg bolus followed by infusion at 15 mg/kg/hr -Check AST/ALT, INR and APAP level 22 hrs into therapy - labs ordered for 8/11 at 0400 -Poison Control contacted - will continue to follow recommendations and communicate to team   Pricilla Riffle, PharmD, BCPS Clinical Pharmacist 07/14/2023 6:14 AM

## 2023-07-15 ENCOUNTER — Other Ambulatory Visit: Payer: Self-pay

## 2023-07-15 DIAGNOSIS — F10129 Alcohol abuse with intoxication, unspecified: Secondary | ICD-10-CM | POA: Diagnosis not present

## 2023-07-15 DIAGNOSIS — T391X2A Poisoning by 4-Aminophenol derivatives, intentional self-harm, initial encounter: Secondary | ICD-10-CM | POA: Diagnosis not present

## 2023-07-15 LAB — CBC
HCT: 37.8 % (ref 36.0–46.0)
Hemoglobin: 12.5 g/dL (ref 12.0–15.0)
MCH: 29.3 pg (ref 26.0–34.0)
MCHC: 33.1 g/dL (ref 30.0–36.0)
MCV: 88.5 fL (ref 80.0–100.0)
Platelets: 234 10*3/uL (ref 150–400)
RBC: 4.27 MIL/uL (ref 3.87–5.11)
RDW: 14.7 % (ref 11.5–15.5)
WBC: 11 10*3/uL — ABNORMAL HIGH (ref 4.0–10.5)
nRBC: 0 % (ref 0.0–0.2)

## 2023-07-15 LAB — ALT: ALT: 17 U/L (ref 0–44)

## 2023-07-15 LAB — PROTIME-INR
INR: 1.1 (ref 0.8–1.2)
Prothrombin Time: 14.4 s (ref 11.4–15.2)

## 2023-07-15 LAB — COMPREHENSIVE METABOLIC PANEL WITH GFR
ALT: 18 U/L (ref 0–44)
AST: 16 U/L (ref 15–41)
Albumin: 3.6 g/dL (ref 3.5–5.0)
Alkaline Phosphatase: 43 U/L (ref 38–126)
Anion gap: 10 (ref 5–15)
BUN: 9 mg/dL (ref 6–20)
CO2: 20 mmol/L — ABNORMAL LOW (ref 22–32)
Calcium: 8.2 mg/dL — ABNORMAL LOW (ref 8.9–10.3)
Chloride: 110 mmol/L (ref 98–111)
Creatinine, Ser: 0.84 mg/dL (ref 0.44–1.00)
GFR, Estimated: >60 mL/min (ref >60)
Glucose, Bld: 76 mg/dL (ref 70–99)
Potassium: 3.4 mmol/L — ABNORMAL LOW (ref 3.5–5.1)
Sodium: 140 mmol/L (ref 135–145)
Total Bilirubin: 0.8 mg/dL (ref 0.3–1.2)
Total Protein: 6.8 g/dL (ref 6.5–8.1)

## 2023-07-15 LAB — ACETAMINOPHEN LEVEL: Acetaminophen (Tylenol), Serum: <10 ug/mL — ABNORMAL LOW (ref 10–30)

## 2023-07-15 LAB — AST: AST: 18 U/L (ref 15–41)

## 2023-07-15 MED ORDER — POTASSIUM CHLORIDE CRYS ER 20 MEQ PO TBCR
40.0000 meq | EXTENDED_RELEASE_TABLET | Freq: Once | ORAL | Status: AC
  Administered 2023-07-15: 40 meq via ORAL
  Filled 2023-07-15: qty 2

## 2023-07-15 MED ORDER — FLUOXETINE HCL 20 MG PO CAPS
20.0000 mg | ORAL_CAPSULE | Freq: Every day | ORAL | Status: DC
  Administered 2023-07-15 – 2023-07-16 (×2): 20 mg via ORAL
  Filled 2023-07-15 (×2): qty 1

## 2023-07-15 MED ORDER — IBUPROFEN 200 MG PO TABS: 200.0000 mg | ORAL_TABLET | Freq: Once | ORAL | Status: DC

## 2023-07-15 NOTE — Discharge Summary (Signed)
Physician Discharge Summary   Patient: Mikayla Glover MRN: [id number] DOB: 24-Jul-1996  Admit date:     07/14/2023  Discharge date: 07/15/23  Discharge Physician: Lynden Oxford  PCP: Pcp, No  Recommendations at discharge:  Establish care at [facility]  Discharge Diagnoses: Principal Problem:   Acetaminophen overdose Active Problems:   Alcohol abuse with intoxication [facility])  Hospital Course: Mikayla Glover is a 27 y.o. female with medical history significant of EtOH abuse who presented to the emergency department due to acetaminophen overdose with suicidal purposes and alcohol intoxication.   Acetaminophen overdose Observed in the stepdown unit. Treated with Mucomyst. Poison control was also notified and following the patient. Since LFTs remain stable as well as Tylenol level become negative and INR remain WNL was able to recommend to stop Mucomyst. No abdominal pain.  No nausea or vomiting. No further concern for Tylenol overdose for now. Medically stable.  Suicidal ideation. Psychiatric consulted. Currently medically stable. Will be transferred to [facility]. Maintain IVC. Must be transported via [place].   Alcohol abuse with intoxication (HCC) Was quite agitated yesterday. Currently agitation is resolved. Alert awake and oriented. Cooperative. CIWA score has remained less than 5.  Obesity Body mass index is 30.38 kg/m.  Placing the pt at higher risk of poor outcomes.  Consultants:  Psychiatric  Procedures performed:  None  DISCHARGE MEDICATION: Allergies as of 07/15/2023   No Known Allergies      Medication List    You have not been prescribed any medications.    Disposition: [facility] Diet recommendation: Regular diet  Discharge Exam: Vitals:   07/15/23 1230 07/15/23 1247 07/15/23 1300 07/15/23 1641  BP:  (!) 143/89 136/89 (!) 143/79  Pulse:  88 87 98  Resp:  (!) 23 19   Temp: 98.9 F (37.2 C)     TempSrc: Oral     SpO2:  99% 100%   Weight:      Height:        General: Appear in no distress; no visible Abnormal Neck Mass Or lumps, Conjunctiva normal Cardiovascular: S1 and S2 Present, no Murmur, Respiratory: good respiratory effort, Bilateral Air entry present and CTA, no Crackles, no wheezes Abdomen: Bowel Sound present, Non tender  Extremities: no Pedal edema Neurology: alert and oriented to time, place, and person  Filed Weights   07/14/23 0410 07/15/23 0800  Weight: 82.8 kg 82.8 kg   Condition at discharge: stable  The results of significant diagnostics from this hospitalization (including imaging, microbiology, ancillary and laboratory) are listed below for reference.   Imaging Studies: No results found.  Microbiology: Results for orders placed or performed during the hospital encounter of 07/14/23  MRSA Next Gen by PCR, Nasal     Status: None   Collection Time: 07/14/23  9:26 AM   Specimen: Nasal Mucosa; Nasal Swab  Result Value Ref Range Status   MRSA by PCR Next Gen NOT DETECTED NOT DETECTED Final    Comment: (NOTE) The GeneXpert MRSA Assay (FDA approved for NASAL specimens only), is one component of a comprehensive MRSA colonization surveillance program. It is not intended to diagnose MRSA infection nor to guide or monitor treatment for MRSA infections. Test performance is not FDA approved in patients less than 41 years old. Performed at [facility], [address] [place], [place] [address]    Labs: CBC: Recent Labs  Lab 07/14/23 0143 07/15/23 0343  WBC 9.4 11.0*  NEUTROABS 5.1  --   HGB 12.8 12.5  HCT  39.3 37.8  MCV 88.5 88.5  PLT 273 234   Basic Metabolic Panel: Recent Labs  Lab 07/14/23 0143 07/14/23 1024 07/15/23 0343  NA 137 140 140  K 3.5 3.6 3.4*  CL 100 103 110  CO2 25 22 20*  GLUCOSE 98 102* 76  BUN 7 8 9   CREATININE 0.89 0.76 0.84  CALCIUM 9.1 8.7* 8.2*  MG  --  2.1  --   PHOS  --  4.4  --    Liver Function Tests: Recent Labs  Lab 07/14/23 0143 07/14/23 1024  07/15/23 0343  AST 23 20 18  16   ALT 23 21 17  18   ALKPHOS 53 47 43  BILITOT 0.4 0.6 0.8  PROT 8.0 7.7 6.8  ALBUMIN 4.6 4.2 3.6   CBG: No results for input(s): "GLUCAP" in the last 168 hours.  Discharge time spent: greater than 30 minutes.  Author: Lynden Oxford, MD  [facility]

## 2023-07-15 NOTE — TOC Initial Note (Signed)
Transition of Care [facility]) - Initial/Assessment Note    Patient Details  Name: Mikayla Glover MRN: [id number] Date of Birth: 08-02-1996  Transition of Care [facility]) CM/SW Contact:    Otelia Santee, LCSW Phone Number: 07/15/2023, 1:30 PM  Clinical Narrative:                 Per MD pt medically stable and recommended for inpatient psychiatric placement.  Currently no bed availability at [facility]. Referrals have been faxed out for placement at the following psychiatric facilities:   [facility]   [facility]   [facility]   [facility]   [facility]   [facility]   [facility]   [facility]   [facility]   [facility]   [facility]   [facility]   [facility]   [facility]   [facility]   [facility]   [facility]   [facility]     Expected Discharge Plan: Psychiatric Hospital Barriers to Discharge: Psych Bed not available   Patient Goals and CMS Choice Patient states their goals for this hospitalization and ongoing recovery are:: Unable to assess CMS Medicare.gov Compare Post Acute Care list provided to:: Patient Choice offered to / list presented to : Patient [place] ownership interest in [facility].provided to:: Patient    Expected Discharge Plan and Services In-house Referral: Clinical Social Work Discharge Planning Services: NA Post Acute Care Choice: NA Living arrangements for the past 2 months: Single Family Home                 DME Arranged: N/A DME Agency:  NA                  Prior Living Arrangements/Services Living arrangements for the past 2 months: Single Family Home Lives with:: Self Patient language and need for interpreter reviewed:: Yes Do you feel safe going back to the place where you live?: Yes      Need for Family Participation in Patient Care: No (Comment) Care giver support system in place?: No (comment)   Criminal Activity/Legal Involvement Pertinent to Current Situation/Hospitalization: No - Comment as needed  Activities of Daily Living Home Assistive Devices/Equipment: None ADL Screening (condition at time of admission) Patient's cognitive ability adequate to safely complete daily activities?: Yes Is the patient deaf or have difficulty hearing?: No Does the patient have difficulty seeing, even when wearing glasses/contacts?: No Does the patient have difficulty concentrating, remembering, or making decisions?: No Patient able to express need for assistance with ADLs?: Yes Does the patient have difficulty dressing or bathing?: No Independently performs ADLs?: Yes (appropriate for developmental age) Does the patient have difficulty walking or climbing stairs?: No Weakness of Legs: None Weakness of Arms/Hands: None  Permission Sought/Granted   Permission granted to share information with : No              Emotional Assessment   Attitude/Demeanor/Rapport: Unable to Assess Affect (typically observed): Unable to Assess Orientation: : Oriented to Self, Oriented to Place, Oriented to  Time, Oriented to Situation Alcohol / Substance Use: Illicit Drugs Psych Involvement: Yes (comment)  Admission diagnosis:  Acetaminophen overdose [T39.1X1A] Acetaminophen overdose of undetermined intent,  initial encounter [T39.1X4A] Patient Active Problem List   Diagnosis Date Noted   Acetaminophen overdose 07/14/2023   Alcohol abuse with intoxication (HCC) 07/14/2023   Sinusitis 07/28/2020   Onychomycosis of toenail  07/28/2020   Allergic rhinitis 07/28/2020   Acquired pes planus 07/28/2020   Subcutaneous nodule 07/28/2020   Fracture of shaft of metacarpal bone 02/10/2020   PCP:  Pcp, No Pharmacy:   [facility] [place] [facility] [address] [place] [address] Phone: [phone] Fax: [phone]  [id number] - [place], [place] - [address] AT [facility] [address] [place] [address] Phone: [phone] Fax: [phone]  [facility] [address] [place], [place] - [address] [place] [address] Phone: [phone] Fax: [phone]     Social Determinants of Health (SDOH) Social History: SDOH Screenings   Food Insecurity: Food Insecurity Present (07/15/2023)  Housing: High Risk (07/15/2023)  Transportation Needs: Unmet Transportation Needs (07/15/2023)  Utilities: Not At Risk (07/15/2023)  Tobacco Use: High Risk (07/14/2023)   SDOH Interventions:     Readmission Risk Interventions    07/15/2023    1:25 PM  Readmission Risk Prevention Plan  Post Dischage Appt Complete  Medication Screening Complete  Transportation Screening Complete

## 2023-07-15 NOTE — Consult Note (Signed)
[facility] Face-to-Face Psychiatry Consult   Reason for Consult:  Tylenol Overdose Referring Physician:  Dr. Robb Matar Patient Identification: Mikayla Glover MRN:  [id number] Principal Diagnosis: Acetaminophen overdose Diagnosis:  Principal Problem:   Acetaminophen overdose Active Problems:   Alcohol abuse with intoxication (HCC)   Total Time spent with patient: 1 hour  Subjective:   Mikayla Glover is a 27 y.o. female patient admitted with  Chief Complaint  Patient presents with   Drug Overdose   .  HPI:   Per Primary Team: Mikayla Glover is a 27 y.o. female with medical history significant of EtOH abuse who presented to the emergency department due to acetaminophen overdose with suicidal purposes and alcohol intoxication.  She received lorazepam 2 mg IVP x 2 in the emergency department, is currently sedated and unable to provide further information at this moment.   On Interview 07/14/2023: Patient seen laying in bed in restraints accompanied by sitter and nurse at bedside. The patient was able to report her name before falling back asleep. Per nursing the patient has been sleeping most of the shift and when she does become aroused she is aggressive with staff. Interview terminated.  On Interview 07/15/2023: Patient seen laying in bed this afternoon on my approach. She reports that she is doing better than when she first got to the hospital. The last thing she remembers is getting a bottle of Tylenol PM from her grandmother before overdosing. She reports that she has been dealing with some stress of her dysfunctional family and recently leaving her job. She also admits to binge drinking 1-2 times weekly to the point that she blacks out. The patient is open to starting medication for depression and anxiety.  Risk to Self:   Risk to Others:   Prior Inpatient Therapy:   Prior Outpatient Therapy:    Past Medical History:  Past Medical History:  Diagnosis Date   ETOH abuse    started drinking at age  83-15    History reviewed. No pertinent surgical history. Family History: History reviewed. No pertinent family history. Family Psychiatric  History:  Social History:  Social History   Substance and Sexual Activity  Alcohol Use Yes   Comment: weekends     Social History   Substance and Sexual Activity  Drug Use Not Currently   Types: Marijuana    Social History   Socioeconomic History   Marital status: Single    Spouse name: Not on file   Number of children: Not on file   Years of education: Not on file   Highest education level: Not on file  Occupational History   Not on file  Tobacco Use   Smoking status: Every Day    Current packs/day: 0.50    Types: Cigarettes, Cigars   Smokeless tobacco: Never  Vaping Use   Vaping status: Never Used  Substance and Sexual Activity   Alcohol use: Yes    Comment: weekends   Drug use: Not Currently    Types: Marijuana   Sexual activity: Not on file  Other Topics Concern   Not on file  Social History Narrative   Not on file   Social Determinants of Health   Financial Resource Strain: Not on file  Food Insecurity: Food Insecurity Present (07/15/2023)   Hunger Vital Sign    Worried About Running Out of Food in the Last Year: Never true    Ran Out of Food in the Last Year: Often true  Transportation Needs: Unmet Transportation Needs (07/15/2023)  PRAPARE - Administrator, Civil Service (Medical): Yes    Lack of Transportation (Non-Medical): Yes  Physical Activity: Not on file  Stress: Not on file  Social Connections: Not on file   Additional Social History:    Allergies:  No Known Allergies  Labs:  Results for orders placed or performed during the hospital encounter of 07/14/23 (from the past 48 hour(s))  CBC with Differential     Status: None   Collection Time: 07/14/23  1:43 AM  Result Value Ref Range   WBC 9.4 4.0 - 10.5 K/uL   RBC 4.44 3.87 - 5.11 MIL/uL   Hemoglobin 12.8 12.0 - 15.0 g/dL   HCT 47.4  25.9 - 56.3 %   MCV 88.5 80.0 - 100.0 fL   MCH 28.8 26.0 - 34.0 pg   MCHC 32.6 30.0 - 36.0 g/dL   RDW 87.5 64.3 - 32.9 %   Platelets 273 150 - 400 K/uL   nRBC 0.0 0.0 - 0.2 %   Neutrophils Relative % 54 %   Neutro Abs 5.1 1.7 - 7.7 K/uL   Lymphocytes Relative 38 %   Lymphs Abs 3.6 0.7 - 4.0 K/uL   Monocytes Relative 5 %   Monocytes Absolute 0.5 0.1 - 1.0 K/uL   Eosinophils Relative 2 %   Eosinophils Absolute 0.2 0.0 - 0.5 K/uL   Basophils Relative 1 %   Basophils Absolute 0.1 0.0 - 0.1 K/uL   Immature Granulocytes 0 %   Abs Immature Granulocytes 0.03 0.00 - 0.07 K/uL    Comment: Performed at [facility], [address] [place], [place] [address]  Comprehensive metabolic panel     Status: None   Collection Time: 07/14/23  1:43 AM  Result Value Ref Range   Sodium 137 135 - 145 mmol/L   Potassium 3.5 3.5 - 5.1 mmol/L   Chloride 100 98 - 111 mmol/L   CO2 25 22 - 32 mmol/L   Glucose, Bld 98 70 - 99 mg/dL    Comment: Glucose reference range applies only to samples taken after fasting for at least 8 hours.   BUN 7 6 - 20 mg/dL   Creatinine, Ser 1.66 0.44 - 1.00 mg/dL   Calcium 9.1 8.9 - 06.3 mg/dL   Total Protein 8.0 6.5 - 8.1 g/dL   Albumin 4.6 3.5 - 5.0 g/dL   AST 23 15 - 41 U/L   ALT 23 0 - 44 U/L   Alkaline Phosphatase 53 38 - 126 U/L   Total Bilirubin 0.4 0.3 - 1.2 mg/dL   GFR, Estimated >01 >60 mL/min    Comment: (NOTE) Calculated using the CKD-EPI Creatinine Equation (2021)    Anion gap 12 5 - 15    Comment: Performed at [facility], [address] [place], [place] [address]  Ethanol     Status: Abnormal   Collection Time: 07/14/23  1:43 AM  Result Value Ref Range   Alcohol, Ethyl (B) 285 (H) <10 mg/dL    Comment: (NOTE) Lowest detectable limit for serum alcohol is 10 mg/dL.  For medical purposes only. Performed at [facility], [address], [place], [place] [address]   Salicylate level     Status:  Abnormal   Collection Time: 07/14/23  1:43 AM  Result Value Ref Range   Salicylate Lvl <7.0 (L) 7.0 - 30.0 mg/dL    Comment: Performed at [facility], [address], [place], [place] [address]  Acetaminophen level     Status: Abnormal   Collection Time: 07/14/23  1:43 AM  Result Value Ref Range   Acetaminophen (Tylenol), Serum 193 (HH) 10 - 30 ug/mL    Comment: CRITICAL RESULT CALLED TO, READ BACK BY AND VERIFIED WITH JOHNSON,J AT 0350 ON 07/14/23 BY LUZOLOP (NOTE) Therapeutic concentrations vary significantly. A range of 10-30 ug/mL  may be an effective concentration for many patients. However, some  are best treated at concentrations outside of this range. Acetaminophen concentrations >150 ug/mL at 4 hours after ingestion  and >50 ug/mL at 12 hours after ingestion are often associated with  toxic reactions.  Performed at [facility], [address], [place], [place] [address]   hCG, serum, qualitative     Status: None   Collection Time: 07/14/23  1:43 AM  Result Value Ref Range   Preg, Serum NEGATIVE NEGATIVE    Comment:        THE SENSITIVITY OF THIS METHODOLOGY IS >10 mIU/mL. Performed at [facility], [address], [place], [place] [address]   Rapid urine drug screen (hospital performed)     Status: Abnormal   Collection Time: 07/14/23  5:05 AM  Result Value Ref Range   Opiates NONE DETECTED NONE DETECTED   Cocaine NONE DETECTED NONE DETECTED   Benzodiazepines NONE DETECTED NONE DETECTED   Amphetamines NONE DETECTED NONE DETECTED   Tetrahydrocannabinol POSITIVE (A) NONE DETECTED   Barbiturates NONE DETECTED NONE DETECTED    Comment: (NOTE) DRUG SCREEN FOR MEDICAL PURPOSES ONLY.  IF CONFIRMATION IS NEEDED FOR ANY PURPOSE, NOTIFY LAB WITHIN 5 DAYS.  LOWEST DETECTABLE LIMITS FOR URINE DRUG SCREEN Drug Class                     Cutoff (ng/mL) Amphetamine and metabolites    1000 Barbiturate and metabolites     200 Benzodiazepine                 200 Opiates and metabolites        300 Cocaine and metabolites        300 THC                            50 Performed at [facility], [address] [place], [place] [address]   MRSA Next Gen by PCR, Nasal     Status: None   Collection Time: 07/14/23  9:26 AM   Specimen: Nasal Mucosa; Nasal Swab  Result Value Ref Range   MRSA by PCR Next Gen NOT DETECTED NOT DETECTED    Comment: (NOTE) The GeneXpert MRSA Assay (FDA approved for NASAL specimens only), is one component of a comprehensive MRSA colonization surveillance program. It is not intended to diagnose MRSA infection nor to guide or monitor treatment for MRSA infections. Test performance is not FDA approved in patients less than 53 years old. Performed at [facility], [address] [place], [place] [address]   Comprehensive metabolic panel     Status: Abnormal   Collection Time: 07/14/23 10:24 AM  Result Value Ref Range   Sodium 140 135 - 145 mmol/L   Potassium 3.6 3.5 - 5.1 mmol/L   Chloride 103 98 - 111 mmol/L   CO2 22 22 - 32 mmol/L   Glucose, Bld 102 (H) 70 - 99 mg/dL    Comment: Glucose reference range applies only to samples taken after fasting for  at least 8 hours.   BUN 8 6 - 20 mg/dL   Creatinine, Ser 1.61 0.44 - 1.00 mg/dL   Calcium 8.7 (L) 8.9 - 10.3 mg/dL   Total Protein 7.7 6.5 - 8.1 g/dL   Albumin 4.2 3.5 - 5.0 g/dL   AST 20 15 - 41 U/L   ALT 21 0 - 44 U/L   Alkaline Phosphatase 47 38 - 126 U/L   Total Bilirubin 0.6 0.3 - 1.2 mg/dL   GFR, Estimated >09 >60 mL/min    Comment: (NOTE) Calculated using the CKD-EPI Creatinine Equation (2021)    Anion gap 15 5 - 15    Comment: Performed at [facility], [address], [place], [place] [address]  Protime-INR     Status: None   Collection Time: 07/14/23 10:24 AM  Result Value Ref Range   Prothrombin Time 15.0 11.4 - 15.2 seconds   INR 1.2 0.8 - 1.2     Comment: (NOTE) INR goal varies based on device and disease states. Performed at [facility], [address] [place], [place] [address]   Magnesium     Status: None   Collection Time: 07/14/23 10:24 AM  Result Value Ref Range   Magnesium 2.1 1.7 - 2.4 mg/dL    Comment: Performed at [facility], [address], [place], [place] [address]  Phosphorus     Status: None   Collection Time: 07/14/23 10:24 AM  Result Value Ref Range   Phosphorus 4.4 2.5 - 4.6 mg/dL    Comment: Performed at [facility], [address], [place], [place] [address]  HIV Antibody (routine testing w rflx)     Status: None   Collection Time: 07/14/23 10:24 AM  Result Value Ref Range   HIV Screen 4th Generation wRfx Non Reactive Non Reactive    Comment: Performed at [facility], [address], [place], [place] [address]  Acetaminophen level     Status: Abnormal   Collection Time: 07/14/23  1:01 PM  Result Value Ref Range   Acetaminophen (Tylenol), Serum 154 (HH) 10 - 30 ug/mL    Comment: CRITICAL RESULT CALLED TO, READ BACK BY AND VERIFIED WITH WOODS, K RN @ 1356 ON 07/14/2023 BY XIONG, K (NOTE) Therapeutic concentrations vary significantly. A range of 10-30 ug/mL  may be an effective concentration for many patients. However, some  are best treated at concentrations outside of this range. Acetaminophen concentrations >150 ug/mL at 4 hours after ingestion  and >50 ug/mL at 12 hours after ingestion are often associated with  toxic reactions.  Performed at [facility], [address] [place], [place] [address]   Protime-INR     Status: None   Collection Time: 07/15/23  3:43 AM  Result Value Ref Range   Prothrombin Time 14.4 11.4 - 15.2 seconds   INR 1.1 0.8 - 1.2    Comment: (NOTE) INR goal varies based on device and disease states. Performed at [facility], [address] [place], [place]  [address]   Acetaminophen level     Status: Abnormal   Collection Time: 07/15/23  3:43 AM  Result Value Ref Range   Acetaminophen (Tylenol), Serum <10 (L) 10 - 30 ug/mL    Comment: (NOTE) Therapeutic concentrations vary significantly. A range of 10-30 ug/mL  may be an effective concentration for many patients. However, some  are best treated at concentrations outside of this range. Acetaminophen concentrations >150 ug/mL  at 4 hours after ingestion  and >50 ug/mL at 12 hours after ingestion are often associated with  toxic reactions.  Performed at [facility], [address], [place], [place] [address]   AST     Status: None   Collection Time: 07/15/23  3:43 AM  Result Value Ref Range   AST 18 15 - 41 U/L    Comment: Performed at [facility], [address], [place], [place] [address]  ALT     Status: None   Collection Time: 07/15/23  3:43 AM  Result Value Ref Range   ALT 17 0 - 44 U/L    Comment: Performed at [facility], [address] [place], [place] [address]  CBC     Status: Abnormal   Collection Time: 07/15/23  3:43 AM  Result Value Ref Range   WBC 11.0 (H) 4.0 - 10.5 K/uL   RBC 4.27 3.87 - 5.11 MIL/uL   Hemoglobin 12.5 12.0 - 15.0 g/dL   HCT 42.5 95.6 - 38.7 %   MCV 88.5 80.0 - 100.0 fL   MCH 29.3 26.0 - 34.0 pg   MCHC 33.1 30.0 - 36.0 g/dL   RDW 56.4 33.2 - 95.1 %   Platelets 234 150 - 400 K/uL   nRBC 0.0 0.0 - 0.2 %    Comment: Performed at [facility], [address], [place], [place] [address]  Comprehensive metabolic panel     Status: Abnormal   Collection Time: 07/15/23  3:43 AM  Result Value Ref Range   Sodium 140 135 - 145 mmol/L   Potassium 3.4 (L) 3.5 - 5.1 mmol/L   Chloride 110 98 - 111 mmol/L   CO2 20 (L) 22 - 32 mmol/L   Glucose, Bld 76 70 - 99 mg/dL    Comment: Glucose reference range applies only to samples taken after fasting for at least 8 hours.   BUN 9 6 - 20 mg/dL    Creatinine, Ser 6.06 0.44 - 1.00 mg/dL   Calcium 8.2 (L) 8.9 - 10.3 mg/dL   Total Protein 6.8 6.5 - 8.1 g/dL   Albumin 3.6 3.5 - 5.0 g/dL   AST 16 15 - 41 U/L   ALT 18 0 - 44 U/L   Alkaline Phosphatase 43 38 - 126 U/L   Total Bilirubin 0.8 0.3 - 1.2 mg/dL   GFR, Estimated >30 >16 mL/min    Comment: (NOTE) Calculated using the CKD-EPI Creatinine Equation (2021)    Anion gap 10 5 - 15    Comment: Performed at [facility], [address] [place], [place] [address]    Current Facility-Administered Medications  Medication Dose Route Frequency Provider Last Rate Last Admin   Chlorhexidine Gluconate Cloth 2 % PADS 6 each  6 each Topical Daily Bobette Mo, MD   6 each at 07/14/23 0927   FLUoxetine (PROZAC) capsule 20 mg  20 mg Oral Daily Clovis Riley, Meet [place] L, DO       folic acid (FOLVITE) tablet 1 mg  1 mg Oral Daily Bobette Mo, MD   1 mg at 07/15/23 0957   LORazepam (ATIVAN) injection 0-4 mg  0-4 mg Intravenous Q4H Bobette Mo, MD   2 mg at 07/14/23 2051   Followed by   Melene Muller ON 07/16/2023] LORazepam (ATIVAN) injection 0-4 mg  0-4 mg Intravenous Q8H Bobette Mo, MD       LORazepam (ATIVAN) tablet 1-4 mg  1-4  mg Oral Q1H PRN Bobette Mo, MD       Or   LORazepam (ATIVAN) injection 1-4 mg  1-4 mg Intravenous Q1H PRN Bobette Mo, MD       multivitamin with minerals tablet 1 tablet  1 tablet Oral Daily Bobette Mo, MD   1 tablet at 07/15/23 1610   nicotine (NICODERM CQ - dosed in mg/24 hours) patch 14 mg  14 mg Transdermal Daily Anthoney Harada, NP   14 mg at 07/15/23 0959   ondansetron (ZOFRAN) tablet 4 mg  4 mg Oral Q6H PRN Bobette Mo, MD       Or   ondansetron [facility]) injection 4 mg  4 mg Intravenous Q6H PRN Bobette Mo, MD       Oral care mouth rinse  15 mL Mouth Rinse PRN Bobette Mo, MD       thiamine (VITAMIN B1) tablet 100 mg  100 mg Oral Daily Bobette Mo, MD   100 mg at  07/15/23 9604   Or   thiamine (VITAMIN B1) injection 100 mg  100 mg Intravenous Daily Bobette Mo, MD   100 mg at 07/14/23 5409    Psychiatric Specialty Exam:  Presentation  General Appearance:  Appropriate for Environment  Eye Contact: Good  Speech: Normal Rate  Speech Volume: Normal  Handedness:No data recorded  Mood and Affect  Mood: Depressed  Affect: Depressed   Thought Process  Thought Processes: Coherent  Descriptions of Associations:No data recorded Orientation:Full (Time, Place and Person)  Thought Content:Logical  History of Schizophrenia/Schizoaffective disorder:No data recorded Duration of Psychotic Symptoms:No data recorded Hallucinations:Hallucinations: None  Ideas of Reference:None  Suicidal Thoughts:Suicidal Thoughts: -- (concern for SI with recent overdose)  Homicidal Thoughts:Homicidal Thoughts: No   Sensorium  Memory:Immediate Good; Remote Good  Judgment: Poor  Insight: Fair   Art therapist  Concentration:Good  Attention Span:Good  Recall:Good  Fund of Knowledge:Fair  Language:Good   Psychomotor Activity  Psychomotor Activity:No data recorded  Assets  Assets:No data recorded  Sleep  Sleep:Sleep: Good   Physical Exam: Physical Exam ROS Blood pressure 136/89, pulse 87, temperature 98.9 F (37.2 C), temperature source Oral, resp. rate 19, height 5\' 5"  (1.651 m), weight 82.8 kg, SpO2 100%. Body mass index is 30.38 kg/m.  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  Harlin Heys, DO 07/15/2023 2:40 PM

## 2023-07-15 NOTE — Plan of Care (Signed)
  Problem: Education: Goal: Knowledge of General Education information will improve Description: Including pain rating scale, medication(s)/side effects and non-pharmacologic comfort measures Outcome: Progressing   Problem: Health Behavior/Discharge Planning: Goal: Ability to manage health-related needs will improve Outcome: Progressing   Problem: Clinical Measurements: Goal: Ability to maintain clinical measurements within normal limits will improve Outcome: Progressing   Problem: Safety: Goal: Non-violent Restraint(s) Outcome: Adequate for Discharge

## 2023-07-15 NOTE — Progress Notes (Signed)
MEDICATION RELATED CONSULT NOTE  Pharmacy Consult for acetylcysteine Indication: acetaminophen overdose  No Known Allergies  Patient Measurements: Weight: 82.8 kg (182 lb 8.7 oz)  Vital Signs: Temp: 99.1 F (37.3 C) (08/11 0339) Temp Source: Oral (08/11 0339) BP: 144/92 (08/11 0300) Pulse Rate: 73 (08/11 0300) Intake/Output from previous day: 08/10 0701 - 08/11 0700 In: 3163.6 [I.V.:3113.6; IV Piggyback:50] Out: -  Intake/Output from this shift: Total I/O In: 1703.9 [I.V.:1703.9] Out: -   Labs: Recent Labs    07/14/23 0143 07/14/23 1024 07/15/23 0343  WBC 9.4  --  11.0*  HGB 12.8  --  12.5  HCT 39.3  --  37.8  PLT 273  --  234  CREATININE 0.89 0.76 0.84  MG  --  2.1  --   PHOS  --  4.4  --   ALBUMIN 4.6 4.2 3.6  PROT 8.0 7.7 6.8  AST 23 20 18  16   ALT 23 21 17  18   ALKPHOS 53 47 43  BILITOT 0.4 0.6 0.8   CrCl cannot be calculated (Unknown ideal weight.).   Microbiology: Recent Results (from the past 720 hour(s))  MRSA Next Gen by PCR, Nasal     Status: None   Collection Time: 07/14/23  9:26 AM   Specimen: Nasal Mucosa; Nasal Swab  Result Value Ref Range Status   MRSA by PCR Next Gen NOT DETECTED NOT DETECTED Final    Comment: (NOTE) The GeneXpert MRSA Assay (FDA approved for NASAL specimens only), is one component of a comprehensive MRSA colonization surveillance program. It is not intended to diagnose MRSA infection nor to guide or monitor treatment for MRSA infections. Test performance is not FDA approved in patients less than 44 years old. Performed at [facility], [address] [place], [place] [address]     Medical History: Past Medical History:  Diagnosis Date   ETOH abuse    started drinking at age 56-15      Assessment: 27 year old female presented after behavioral episode per mother. Patient was found to have vomited and EMS reports approximately 10 tablets of Tylenol 500 mg present in the emesis. Suspected  ingestion around midnight. APAP level drawn ~ 0145 at 193.   EDP started acetylcysteine.  Recheck of labs after ~ 22 hrs of therapy with normal AST/ALT, INR and APAP level now undetectable. Information relayed to Poison Control who confirmed no further therapy necessary at this time.   Plan:  -Discussed with [facility] NP - ok to discontinue acetylcysteine infusion -No further pharmacy monitoring indicated   Pricilla Riffle, PharmD, BCPS Clinical Pharmacist 07/15/2023 4:54 AM

## 2023-07-16 ENCOUNTER — Encounter: Payer: Self-pay | Admitting: Addiction Medicine

## 2023-07-16 ENCOUNTER — Inpatient Hospital Stay
Admission: AD | Admit: 2023-07-16 | Discharge: 2023-07-20 | DRG: 885 | Disposition: A | Discharge status: Home / Self Care | Payer: Medicaid Other | Source: Ambulatory Visit | Attending: Psychiatry | Admitting: Psychiatry

## 2023-07-16 DIAGNOSIS — Z5982 Transportation insecurity: Secondary | ICD-10-CM

## 2023-07-16 DIAGNOSIS — T50902A Poisoning by unspecified drugs, medicaments and biological substances, intentional self-harm, initial encounter: Secondary | ICD-10-CM | POA: Diagnosis present

## 2023-07-16 DIAGNOSIS — R4587 Impulsiveness: Secondary | ICD-10-CM | POA: Diagnosis present

## 2023-07-16 DIAGNOSIS — T391X2A Poisoning by 4-Aminophenol derivatives, intentional self-harm, initial encounter: Secondary | ICD-10-CM | POA: Diagnosis present

## 2023-07-16 DIAGNOSIS — Z5941 Food insecurity: Secondary | ICD-10-CM | POA: Diagnosis not present

## 2023-07-16 DIAGNOSIS — F418 Other specified anxiety disorders: Secondary | ICD-10-CM | POA: Diagnosis present

## 2023-07-16 DIAGNOSIS — F1020 Alcohol dependence, uncomplicated: Secondary | ICD-10-CM | POA: Diagnosis present

## 2023-07-16 DIAGNOSIS — Z79899 Other long term (current) drug therapy: Secondary | ICD-10-CM

## 2023-07-16 DIAGNOSIS — T391X1A Poisoning by 4-Aminophenol derivatives, accidental (unintentional), initial encounter: Secondary | ICD-10-CM | POA: Diagnosis present

## 2023-07-16 DIAGNOSIS — F10229 Alcohol dependence with intoxication, unspecified: Secondary | ICD-10-CM | POA: Diagnosis present

## 2023-07-16 DIAGNOSIS — F1220 Cannabis dependence, uncomplicated: Secondary | ICD-10-CM | POA: Diagnosis present

## 2023-07-16 DIAGNOSIS — F17290 Nicotine dependence, other tobacco product, uncomplicated: Secondary | ICD-10-CM | POA: Diagnosis present

## 2023-07-16 DIAGNOSIS — F10129 Alcohol abuse with intoxication, unspecified: Secondary | ICD-10-CM | POA: Diagnosis present

## 2023-07-16 DIAGNOSIS — Z811 Family history of alcohol abuse and dependence: Secondary | ICD-10-CM | POA: Diagnosis not present

## 2023-07-16 DIAGNOSIS — F332 Major depressive disorder, recurrent severe without psychotic features: Principal | ICD-10-CM | POA: Diagnosis present

## 2023-07-16 DIAGNOSIS — F17210 Nicotine dependence, cigarettes, uncomplicated: Secondary | ICD-10-CM | POA: Diagnosis present

## 2023-07-16 MED ORDER — DIPHENHYDRAMINE HCL 25 MG PO CAPS: 50.0000 mg | ORAL_CAPSULE | Freq: Three times a day (TID) | ORAL | Status: DC | PRN

## 2023-07-16 MED ORDER — LORAZEPAM 2 MG PO TABS: 2.0000 mg | ORAL_TABLET | Freq: Three times a day (TID) | ORAL | Status: DC | PRN

## 2023-07-16 MED ORDER — HALOPERIDOL 5 MG PO TABS: 5.0000 mg | ORAL_TABLET | Freq: Three times a day (TID) | ORAL | Status: DC | PRN

## 2023-07-16 MED ORDER — ALUM & MAG HYDROXIDE-SIMETH 200-200-20 MG/5ML PO SUSP: 30.0000 mL | ORAL | Status: DC | PRN

## 2023-07-16 MED ORDER — MAGNESIUM HYDROXIDE 400 MG/5ML PO SUSP: 30.0000 mL | Freq: Every day | ORAL | Status: DC | PRN

## 2023-07-16 MED ORDER — DIPHENHYDRAMINE HCL 50 MG/ML IJ SOLN: 50.0000 mg | Freq: Three times a day (TID) | INTRAMUSCULAR | Status: DC | PRN

## 2023-07-16 MED ORDER — ACETAMINOPHEN 325 MG PO TABS: 650.0000 mg | ORAL_TABLET | Freq: Four times a day (QID) | ORAL | Status: DC | PRN

## 2023-07-16 MED ORDER — HALOPERIDOL LACTATE 5 MG/ML IJ SOLN: 5.0000 mg | Freq: Three times a day (TID) | INTRAMUSCULAR | Status: DC | PRN

## 2023-07-16 MED ORDER — LORAZEPAM 2 MG/ML IJ SOLN: 2.0000 mg | Freq: Three times a day (TID) | INTRAMUSCULAR | Status: DC | PRN

## 2023-07-16 NOTE — Group Note (Signed)
Date:  07/16/2023 Time:  10:53 PM  Group Topic/Focus:  Wrap-Up Group:   The focus of this group is to help patients review their daily goal of treatment and discuss progress on daily workbooks.    Participation Level:  Active  Participation Quality:  Appropriate and Attentive  Affect:  Appropriate  Cognitive:  Alert and Appropriate  Insight: Good  Engagement in Group:  Developing/Improving  Modes of Intervention:  Clarification, Discussion, and Education  Additional Comments:     Laquitta Dominski 07/16/2023, 10:53 PM

## 2023-07-16 NOTE — Progress Notes (Signed)
   07/16/23 1145  Psych Admission Type (Psych Patients Only)  Admission Status Involuntary  Psychosocial Assessment  Patient Complaints Anxiety;Confusion  Eye Contact Fair  Facial Expression Anxious;Flat  Affect Anxious;Depressed  Speech Soft  Interaction Cautious  Motor Activity Slow  Appearance/Hygiene Unremarkable  Behavior Characteristics Calm;Guarded;Impulsive  Mood Depressed;Preoccupied  Thought Process  Coherency Blocking  Content Ambivalence;Preoccupation  Delusions None reported or observed  Perception Depersonalization  Hallucination None reported or observed  Judgment Poor  Confusion Mild  Danger to Self  Current suicidal ideation? Passive  Description of Suicide Plan Patient states she does not have a plan.  She states she drank until ahe passed out.  Agreement Not to Harm Self Yes  Description of Agreement Verbal  Danger to Others  Danger to Others None reported or observed   1145 Patient arrived to the unit.  Patient is alert and oriented X4.  Patient is mildly confused.  Patient states she drank alcohol until she passed out.  Patient stated she has occasionally drank to passing out since 2022.  Per patient she passes out every month or more.  Patient states she does not know why she drink to excess.  Patient states this is her first time seeking assistance.  Patient states she has drank to the point of becoming combative and ended up in jail.  Last time was jailed was about 2-3 months ago.  Patient lives with her parents and sister.  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  Patient is pleasant and cooperative.  Patient is on Q 15 minute checks.

## 2023-07-16 NOTE — Group Note (Signed)
Recreation Therapy Group Note   Group Topic:General Recreation  Group Date: 07/16/2023 Start Time: 1015 End Time: 1120 Facilitators: Rosina Lowenstein, LRT, CTRS Location: Courtyard  Group Description: Outdoor Recreation. Patients had the option to play basketball, play with a deck of cards or sit and listen to music while outside in the courtyard getting fresh air and sunlight. LRT and pts discussed things that they enjoy doing in their free time outside of the hospital.   Goal Area(s) Addressed: Patient will identify leisure interests.  Patient will practice healthy decision making. Patient will engage in recreation activity.   Affect/Mood: N/A   Participation Level: Did not attend    Clinical Observations/Individualized Feedback: Mikayla Glover did not attend group due to not being on the unit yet.  Plan: Continue to engage patient in RT group sessions 2-3x/week.   Rosina Lowenstein, LRT, CTRS 07/16/2023 11:48 AM

## 2023-07-16 NOTE — Progress Notes (Signed)
[facility] PROGRESS NOTE  Patient: Mikayla Glover UVO:[id number]   PCP: Pcp, No DOB: 1996/09/17   DOA: 07/14/2023   DOS: 07/16/2023    Subjective: seen at bedside, no acute complains  Objective:  Vitals:   07/16/23 0811 07/16/23 0812 07/16/23 0815 07/16/23 0900  BP: 137/86     Pulse: 78 100 73 79  Resp:   19 18  Temp:      TempSrc:      SpO2:  100% 100% 100%  Weight:      Height:       No tremors..  Clear to auscultation   Assessment and plan: Remains medically stable to transfer to [facility] with law enforcement.  Recommended to encourage oral hydration.   Author: Lynden Oxford, MD [facility] 07/16/2023 9:40 AM   If 7PM-7AM, please contact night-coverage at [email or web address]

## 2023-07-16 NOTE — Group Note (Unsigned)
Date:  07/16/2023 Time:  11:00 PM  Group Topic/Focus:  Wrap-Up Group:   The focus of this group is to help patients review their daily goal of treatment and discuss progress on daily workbooks.     Participation Level:  {BHH PARTICIPATION ZOXWR:60454}  Participation Quality:  {BHH PARTICIPATION QUALITY:22265}  Affect:  {BHH AFFECT:22266}  Cognitive:  {BHH COGNITIVE:22267}  Insight: {BHH Insight2:20797}  Engagement in Group:  {BHH ENGAGEMENT IN UJWJX:91478}  Modes of Intervention:  {BHH MODES OF INTERVENTION:22269}  Additional Comments:  ***  Mikayla Glover 07/16/2023, 11:00 PM

## 2023-07-16 NOTE — Group Note (Unsigned)
Date:  07/16/2023 Time:  10:58 PM  Group Topic/Focus:  Wrap-Up Group:   The focus of this group is to help patients review their daily goal of treatment and discuss progress on daily workbooks.     Participation Level:  {BHH PARTICIPATION ZOXWR:60454}  Participation Quality:  {BHH PARTICIPATION QUALITY:22265}  Affect:  {BHH AFFECT:22266}  Cognitive:  {BHH COGNITIVE:22267}  Insight: {BHH Insight2:20797}  Engagement in Group:  {BHH ENGAGEMENT IN UJWJX:91478}  Modes of Intervention:  {BHH MODES OF INTERVENTION:22269}  Additional Comments:  ***  Mikayla Glover 07/16/2023, 10:58 PM

## 2023-07-16 NOTE — Plan of Care (Signed)
  Problem: Activity: Goal: Risk for activity intolerance will decrease Outcome: Progressing   Problem: Nutrition: Goal: Adequate nutrition will be maintained Outcome: Progressing   Problem: Pain Managment: Goal: General experience of comfort will improve Outcome: Progressing   

## 2023-07-16 NOTE — Group Note (Signed)
[facility] LCSW Group Therapy Note    Group Date: 07/16/2023 Start Time: 1310 End Time: 1410  Type of Therapy and Topic:  Group Therapy:  Overcoming Obstacles  Participation Level:  BHH PARTICIPATION LEVEL: Active  Description of Group:   In this group patients will be encouraged to explore what they see as obstacles to their own wellness and recovery. They will be guided to discuss their thoughts, feelings, and behaviors related to these obstacles. The group will process together ways to cope with barriers, with attention given to specific choices patients can make. Each patient will be challenged to identify changes they are motivated to make in order to overcome their obstacles. This group will be process-oriented, with patients participating in exploration of their own experiences as well as giving and receiving support and challenge from other group members.  Therapeutic Goals: 1. Patient will identify personal and current obstacles as they relate to admission. 2. Patient will identify barriers that currently interfere with their wellness or overcoming obstacles.  3. Patient will identify feelings, thought process and behaviors related to these barriers. 4. Patient will identify two changes they are willing to make to overcome these obstacles:    Summary of Patient Progress Patient was present for the majority of the group. She was actively involved in the discussion and made comments that were pertinent to the discussion.  Pt appears to have some insight into the topic and engages appropriately. She was also open and receptive to feedback from other group members and facilitator.    Therapeutic Modalities:   Cognitive Behavioral Therapy Solution Focused Therapy Motivational Interviewing Relapse Prevention Therapy   Glenis Smoker, LCSW

## 2023-07-16 NOTE — Group Note (Signed)
Date:  07/16/2023 Time:  4:21 PM  Group Topic/Focus:  OUTDOOR RECREATION ACTIVITY    Participation Level:  Active  Participation Quality:  Appropriate  Affect:  Appropriate  Cognitive:  Alert and Appropriate  Insight: Appropriate  Engagement in Group:  Engaged  Modes of Intervention:  Activity, Rapport Building, and Socialization  Additional Comments:    Rosaura Carpenter 07/16/2023, 4:21 PM

## 2023-07-16 NOTE — Progress Notes (Signed)
Pt IV removed, WNL. D/C instructions given to patient, verbalized understanding. Sheriff[employer] to be here within 10 mins to transport patient to [facility].

## 2023-07-16 NOTE — Progress Notes (Signed)
Patient was transported to [facility] by sheriff. T-shirt of patient's given to sheriff along with IVC paperwork and discharge instructions. Report called to Laurier Nancy, RN prior to transport.

## 2023-07-16 NOTE — Group Note (Unsigned)
Date:  07/16/2023 Time:  11:06 PM  Group Topic/Focus:  Wrap-Up Group:   The focus of this group is to help patients review their daily goal of treatment and discuss progress on daily workbooks.     Participation Level:  {BHH PARTICIPATION ZOXWR:60454}  Participation Quality:  {BHH PARTICIPATION QUALITY:22265}  Affect:  {BHH AFFECT:22266}  Cognitive:  {BHH COGNITIVE:22267}  Insight: {BHH Insight2:20797}  Engagement in Group:  {BHH ENGAGEMENT IN UJWJX:91478}  Modes of Intervention:  {BHH MODES OF INTERVENTION:22269}  Additional Comments:  ***  Aleaha Fickling 07/16/2023, 11:06 PM

## 2023-07-16 NOTE — Group Note (Signed)
Date:  07/16/2023 Time:  11:10 PM  Group Topic/Focus:  Wrap-Up Group:   The focus of this group is to help patients review their daily goal of treatment and discuss progress on daily workbooks.    Participation Level:  Active  Participation Quality:  Appropriate and Attentive  Affect:  Appropriate  Cognitive:  Alert and Appropriate  Insight: Appropriate and Good  Engagement in Group:  Developing/Improving  Modes of Intervention:  Clarification, Discussion, and Education  Additional Comments:     Ranier Coach 07/16/2023, 11:10 PM

## 2023-07-17 DIAGNOSIS — F332 Major depressive disorder, recurrent severe without psychotic features: Secondary | ICD-10-CM

## 2023-07-17 DIAGNOSIS — F1020 Alcohol dependence, uncomplicated: Secondary | ICD-10-CM | POA: Diagnosis present

## 2023-07-17 MED ORDER — LOPERAMIDE HCL 2 MG PO CAPS: 2.0000 mg | ORAL_CAPSULE | ORAL | Status: AC | PRN

## 2023-07-17 MED ORDER — LORAZEPAM 1 MG PO TABS: 1.0000 mg | ORAL_TABLET | Freq: Two times a day (BID) | ORAL | Status: DC

## 2023-07-17 MED ORDER — LORAZEPAM 1 MG PO TABS: 1.0000 mg | ORAL_TABLET | Freq: Every day | ORAL | Status: DC

## 2023-07-17 MED ORDER — LORAZEPAM 1 MG PO TABS: 1.0000 mg | ORAL_TABLET | Freq: Four times a day (QID) | ORAL | Status: AC | PRN

## 2023-07-17 MED ORDER — ONDANSETRON 4 MG PO TBDP: 4.0000 mg | ORAL_TABLET | Freq: Four times a day (QID) | ORAL | Status: AC | PRN

## 2023-07-17 MED ORDER — LORAZEPAM 1 MG PO TABS: 1.0000 mg | ORAL_TABLET | Freq: Three times a day (TID) | ORAL | Status: DC

## 2023-07-17 MED ORDER — THIAMINE MONONITRATE 100 MG PO TABS
100.0000 mg | ORAL_TABLET | Freq: Every day | ORAL | Status: DC
  Administered 2023-07-18 – 2023-07-20 (×2): 100 mg via ORAL
  Filled 2023-07-17 (×3): qty 1

## 2023-07-17 MED ORDER — HYDROXYZINE HCL 25 MG PO TABS: 25.0000 mg | ORAL_TABLET | Freq: Four times a day (QID) | ORAL | Status: AC | PRN

## 2023-07-17 MED ORDER — ADULT MULTIVITAMIN W/MINERALS CH
1.0000 | ORAL_TABLET | Freq: Every day | ORAL | Status: DC
  Administered 2023-07-17 – 2023-07-20 (×3): 1 via ORAL
  Filled 2023-07-17 (×4): qty 1

## 2023-07-17 MED ORDER — THIAMINE HCL 100 MG/ML IJ SOLN
100.0000 mg | Freq: Once | INTRAMUSCULAR | Status: DC
  Filled 2023-07-17: qty 2

## 2023-07-17 MED ORDER — LORAZEPAM 1 MG PO TABS
1.0000 mg | ORAL_TABLET | Freq: Four times a day (QID) | ORAL | Status: DC
  Filled 2023-07-17: qty 1

## 2023-07-17 NOTE — Progress Notes (Signed)
D- Patient alert and oriented x 4. Affect bright/mood euthymic. Denies SI/ HI/ AVH. Patient denies pain. Patient denies depression and anxiety. She denies ETOH withdrawal and refuses Ativan. States her goal is to "express myself" and "talk more". A- Scheduled Ativan refused. Support and encouragement provided.  Routine safety checks conducted every 15 minutes without incident.  Patient informed to notify staff with problems or concerns and verbalizes understanding. R- No adverse drug reactions noted.  Patient compliant with medications and treatment plan. Patient receptive, calm, cooperative and interacts well with others on the unit.  Patient contracts for safety and  remains safe on the unit at this time.

## 2023-07-17 NOTE — Group Note (Signed)
Date:  07/17/2023 Time:  10:09 AM  Group Topic/Focus:  Goals Group:   The focus of this group is to help patients establish daily goals to achieve during treatment and discuss how the patient can incorporate goal setting into their daily lives to aide in recovery.    Participation Level:  Active  Participation Quality:  Appropriate  Affect:  Appropriate  Cognitive:  Appropriate  Insight: Appropriate  Engagement in Group:  Engaged  Modes of Intervention:  Discussion, Education, and Support  Additional Comments:    Wilford Corner 07/17/2023, 10:09 AM

## 2023-07-17 NOTE — Plan of Care (Signed)
  Problem: Education: Goal: Knowledge of General Education information will improve Description: Including pain rating scale, medication(s)/side effects and non-pharmacologic comfort measures Outcome: Progressing   Problem: Health Behavior/Discharge Planning: Goal: Ability to manage health-related needs will improve Outcome: Progressing   Problem: Clinical Measurements: Goal: Ability to maintain clinical measurements within normal limits will improve Outcome: Progressing Goal: Will remain free from infection Outcome: Progressing Goal: Diagnostic test results will improve Outcome: Progressing Goal: Respiratory complications will improve Outcome: Progressing Goal: Cardiovascular complication will be avoided Outcome: Progressing   Problem: Activity: Goal: Risk for activity intolerance will decrease Outcome: Progressing   Problem: Nutrition: Goal: Adequate nutrition will be maintained Outcome: Progressing   Problem: Coping: Goal: Level of anxiety will decrease Outcome: Progressing   Problem: Elimination: Goal: Will not experience complications related to bowel motility Outcome: Progressing Goal: Will not experience complications related to urinary retention Outcome: Progressing   Problem: Pain Managment: Goal: General experience of comfort will improve Outcome: Progressing   Problem: Safety: Goal: Ability to remain free from injury will improve Outcome: Progressing   Problem: Skin Integrity: Goal: Risk for impaired skin integrity will decrease Outcome: Progressing   Problem: Education: Goal: Knowledge of [facility] Education information/materials will improve Outcome: Progressing Goal: Emotional status will improve Outcome: Progressing Goal: Mental status will improve Outcome: Progressing Goal: Verbalization of understanding the information provided will improve Outcome: Progressing   Problem: Activity: Goal: Interest or engagement in activities will  improve Outcome: Progressing Goal: Sleeping patterns will improve Outcome: Progressing   Problem: Coping: Goal: Ability to verbalize frustrations and anger appropriately will improve Outcome: Progressing Goal: Ability to demonstrate self-control will improve Outcome: Progressing   Problem: Health Behavior/Discharge Planning: Goal: Identification of resources available to assist in meeting health care needs will improve Outcome: Progressing Goal: Compliance with treatment plan for underlying cause of condition will improve Outcome: Progressing   Problem: Physical Regulation: Goal: Ability to maintain clinical measurements within normal limits will improve Outcome: Progressing   Problem: Safety: Goal: Periods of time without injury will increase Outcome: Progressing   

## 2023-07-17 NOTE — Progress Notes (Signed)
Nursing Shift Note:  1900-0700  Attended Evening Group: yes Medication Compliant: n/a  Behavior: cooperative Sleep Quality: good Significant Changes: n/a  Patient acclimated to the unit, attended group, and ate a snack.  No significant symptom of withdrawal observed or endorsed.  Mikayla Glover fell asleep before 2300.  No issues to note.

## 2023-07-17 NOTE — H&P (Signed)
Psychiatric Admission Assessment Adult  Patient Identification: Mikayla Glover MRN:  [id number] Date of Evaluation:  07/17/2023 Chief Complaint:  MDD (major depressive disorder), recurrent episode, severe (HCC) [F33.2] Principal Diagnosis: Major depressive disorder, recurrent severe without psychotic features (HCC) Diagnosis:  Principal Problem:   Major depressive disorder, recurrent severe without psychotic features (HCC) Active Problems:   Acetaminophen overdose   Alcohol abuse with intoxication (HCC)   Suicidal overdose, initial encounter (HCC)   Alcohol dependence (HCC)  History of Present Illness:  27 yo female presented after drinking excessively and overdosing on acetaminophen. Today, on assessment, she is calm and cooperative, engaged easily in conversation. She stated, "I got really drunk and tried to take myself out. I drank and blacked out. I'm a different person when I drink". She minimizes her drinking and stated the last time she drank before this incident was 2 months ago. Mikayla Glover continued to elaborate on this that she feels she can handle it when she gets around her family and then she cannot. Denies withdrawal symptoms when she is not drinking, no history of withdrawal seizures. She does smoke 1/3 ppd of cigarettes, cannabis via blunts or joints 4-5 times per day. "A little bit of anxiety", denies panic attacks. Trauma from the past with nightmares and flashbacks, last ones were 3 years ago. Vivid dreams, not nightmares, sleep is "fine". Appetite without issues, no weight changes. Denies taking medications other than vitamins. She was started on an antidepressant in the hospital and did not like how if made her feel, does not want any medications at this time.   Associated Signs/Symptoms: Depression Symptoms:  suicidal attempt, anxiety, (Hypo) Manic Symptoms:   none Anxiety Symptoms:   none Psychotic Symptoms:   none PTSD Symptoms: Had a traumatic exposure:  past trauma Total  Time spent with patient: 1 hour  Past Psychiatric History: alcohol abuse  Is the patient at risk to self? No.  Has the patient been a risk to self in the past 6 months? Yes.    Has the patient been a risk to self within the distant past? No.  Is the patient a risk to others? No.  Has the patient been a risk to others in the past 6 months? No.  Has the patient been a risk to others within the distant past? No.   [place] Scale:  Flowsheet Row Admission (Current) from 07/16/2023 in [facility]PAT[facility] ED to Hosp-Admission (Discharged) from 07/14/2023 in [facility] from 06/11/2022 in [facility] Emergency Department at [facility]  C-SSRS RISK CATEGORY Low Risk No Risk No Risk        Prior Inpatient Therapy: No. If yes, describe:  NA Prior Outpatient Therapy: No. If yes, describeNA   Alcohol Screening: 1. How often do you have a drink containing alcohol?: Monthly or less 2. How many drinks containing alcohol do you have on a typical day when you are drinking?: 10 or more 3. How often do you have six or more drinks on one occasion?: Monthly AUDIT-C Score: 7 4. How often during the last year have you found that you were not able to stop drinking once you had started?: Less than monthly 5. How often during the last year have you failed to do what was normally expected from you because of drinking?: Less than monthly 6. How often during the last year have you needed a first drink in the morning to get yourself going after a heavy drinking session?: Never 7. How  often during the last year have you had a feeling of guilt of remorse after drinking?: Less than monthly 8. How often during the last year have you been unable to remember what happened the night before because you had been drinking?: Monthly 9. Have you or someone else been injured as a result of your drinking?: Yes, but not in the last year 10. Has a relative or friend or a  doctor or another health worker been concerned about your drinking or suggested you cut down?: Yes, but not in the last year Alcohol Use Disorder Identification Test Final Score (AUDIT): 16 Alcohol Brief Interventions/Follow-up: Alcohol education/Brief advice Substance Abuse History in the last 12 months:  Yes.   Consequences of Substance Abuse: Blackouts:  with overdose Previous Psychotropic Medications: No  Psychological Evaluations: No  Past Medical History:  Past Medical History:  Diagnosis Date   ETOH abuse    started drinking at age 35-15    History reviewed. No pertinent surgical history. Family History: History reviewed. No pertinent family history. Family Psychiatric  History: mother with alcohol abuse Tobacco Screening:  Social History   Tobacco Use  Smoking Status Every Day   Current packs/day: 0.50   Types: Cigarettes, Cigars  Smokeless Tobacco Never    BH Tobacco Counseling     Are you interested in Tobacco Cessation Medications?  No value filed. Counseled patient on smoking cessation:  No value filed. Reason Tobacco Screening Not Completed: No value filed.       Social History:  Social History   Substance and Sexual Activity  Alcohol Use Yes   Comment: weekends     Social History   Substance and Sexual Activity  Drug Use Not Currently   Types: Marijuana    Additional Social History:  predominately lives with her girlfriend   Allergies:  No Known Allergies Lab Results: No results found for this or any previous visit (from the past 48 hour(s)).  Blood Alcohol level:  Lab Results  Component Value Date   ETH 285 (H) 07/14/2023   ETH 135 (H) 12/05/2018    Metabolic Disorder Labs:  No results found for: "HGBA1C", "MPG" No results found for: "PROLACTIN" No results found for: "CHOL", "TRIG", "HDL", "CHOLHDL", "VLDL", "LDLCALC"  Current Medications: Current Facility-Administered Medications  Medication Dose Route Frequency Provider Last Rate  Last Admin   acetaminophen (TYLENOL) tablet 650 mg  650 mg Oral Q6H PRN Starkes-Perry, Juel Burrow, FNP       alum & mag hydroxide-simeth (MAALOX/MYLANTA) 200-200-20 MG/5ML suspension 30 mL  30 mL Oral Q4H PRN Starkes-Perry, Juel Burrow, FNP       diphenhydrAMINE (BENADRYL) capsule 50 mg  50 mg Oral TID PRN Maryagnes Amos, FNP       Or   diphenhydrAMINE (BENADRYL) injection 50 mg  50 mg Intramuscular TID PRN Starkes-Perry, Juel Burrow, FNP       haloperidol (HALDOL) tablet 5 mg  5 mg Oral TID PRN Maryagnes Amos, FNP       Or   haloperidol lactate (HALDOL) injection 5 mg  5 mg Intramuscular TID PRN Starkes-Perry, Juel Burrow, FNP       hydrOXYzine (ATARAX) tablet 25 mg  25 mg Oral Q6H PRN Charm Rings, NP       loperamide (IMODIUM) capsule 2-4 mg  2-4 mg Oral PRN Charm Rings, NP       LORazepam (ATIVAN) tablet 2 mg  2 mg Oral TID PRN Rosario Adie Juel Burrow, FNP  Or   LORazepam (ATIVAN) injection 2 mg  2 mg Intramuscular TID PRN Starkes-Perry, Juel Burrow, FNP       LORazepam (ATIVAN) tablet 1 mg  1 mg Oral Q6H PRN Charm Rings, NP       LORazepam (ATIVAN) tablet 1 mg  1 mg Oral QID Charm Rings, NP       Followed by   Melene Muller ON 07/18/2023] LORazepam (ATIVAN) tablet 1 mg  1 mg Oral TID Charm Rings, NP       Followed by   Melene Muller ON 07/19/2023] LORazepam (ATIVAN) tablet 1 mg  1 mg Oral BID Charm Rings, NP       Followed by   Melene Muller ON 07/20/2023] LORazepam (ATIVAN) tablet 1 mg  1 mg Oral Daily Landen Breeland Y, NP       magnesium hydroxide (MILK OF MAGNESIA) suspension 30 mL  30 mL Oral Daily PRN Starkes-Perry, Juel Burrow, FNP       multivitamin with minerals tablet 1 tablet  1 tablet Oral Daily Charm Rings, NP   1 tablet at 07/17/23 1037   ondansetron (ZOFRAN-ODT) disintegrating tablet 4 mg  4 mg Oral Q6H PRN Charm Rings, NP       thiamine (VITAMIN B1) injection 100 mg  100 mg Intramuscular Once Charm Rings, NP       [START ON 07/18/2023] thiamine (VITAMIN B1) tablet  100 mg  100 mg Oral Daily Charm Rings, NP       PTA Medications: No medications prior to admission.   Musculoskeletal: Strength & Muscle Tone: within normal limits Gait & Station: normal Patient leans: N/A   Psychiatric Specialty Exam: Physical Exam Vitals and nursing note reviewed.  Constitutional:      Appearance: Normal appearance.  HENT:     Head: Normocephalic.     Nose: Nose normal.  Pulmonary:     Effort: Pulmonary effort is normal.  Musculoskeletal:        General: Normal range of motion.     Cervical back: Normal range of motion.  Neurological:     General: No focal deficit present.     Mental Status: She is alert and oriented to person, place, and time.  Psychiatric:        Attention and Perception: Attention and perception normal.        Mood and Affect: Mood is anxious.        Speech: Speech normal.        Behavior: Behavior normal. Behavior is cooperative.        Thought Content: Thought content normal.        Cognition and Memory: Cognition and memory normal.        Judgment: Judgment normal.       Review of Systems  Psychiatric/Behavioral:  Positive for substance abuse. The patient is nervous/anxious.   All other systems reviewed and are negative.    Blood pressure 129/85, pulse 63, temperature (!) 97 F (36.1 C), resp. rate 18, height 5\' 6"  (1.676 m), weight 81.2 kg, SpO2 100%.Body mass index is 28.89 kg/m.  General Appearance: Casual  Eye Contact:  Fair  Speech:  Normal Rate  Volume:  Normal  Mood:  Anxious and Depressed  Affect:  Congruent  Thought Process:  Coherent  Orientation:  Full (Time, Place, and Person)  Thought Content:  Logical  Suicidal Thoughts:  No  Homicidal Thoughts:  No  Memory:  Immediate;   Good Recent;  Good Remote;   Good  Judgement:  Fair  Insight:  Fair  Psychomotor Activity:  Normal  Concentration:  Concentration: Good and Attention Span: Good  Recall:  Good  Fund of Knowledge:  Good  Language:  Good   Akathisia:  No  Handed:  Right  AIMS (if indicated):     Assets:  Housing Intimacy Leisure Time Physical Health Resilience Social Support  ADL's:  Intact  Cognition:  WNL  Sleep:         Physical Exam: Physical Exam Vitals and nursing note reviewed.  Constitutional:      Appearance: Normal appearance.  HENT:     Head: Normocephalic.     Nose: Nose normal.  Pulmonary:     Effort: Pulmonary effort is normal.  Musculoskeletal:        General: Normal range of motion.     Cervical back: Normal range of motion.  Neurological:     General: No focal deficit present.     Mental Status: She is alert and oriented to person, place, and time.  Psychiatric:        Attention and Perception: Attention and perception normal.        Mood and Affect: Mood is anxious and depressed.        Speech: Speech normal.        Behavior: Behavior normal. Behavior is cooperative.        Thought Content: Thought content normal.        Cognition and Memory: Cognition and memory normal.        Judgment: Judgment normal.    Review of Systems  Psychiatric/Behavioral:  Positive for substance abuse. The patient is nervous/anxious.   All other systems reviewed and are negative.  Blood pressure 129/85, pulse 63, temperature (!) 97 F (36.1 C), resp. rate 18, height 5\' 6"  (1.676 m), weight 81.2 kg, SpO2 100%. Body mass index is 28.89 kg/m.  Treatment Plan Summary: Daily contact with patient to assess and evaluate symptoms and progress in treatment, Medication management, and Plan : Major depressive disorder, recurrent, severe without psychosis: Declined medications Group therapy   Alcohol Abuse: CIWA protocol in place, denies withdrawal symptoms and declined Ativan coverage.  Observation Level/Precautions:  15 minute checks  Laboratory:  Completed, reviewed, stable  Psychotherapy:  individual and group therapy  Medications:  None  Consultations:  NOne  Discharge Concerns:  None  Estimated  LOS:  3-5 days  Other:     Physician Treatment Plan for Primary Diagnosis: Major depressive disorder, recurrent severe without psychotic features (HCC) Long Term Goal(s): Improvement in symptoms so as ready for discharge  Short Term Goals: Ability to identify changes in lifestyle to reduce recurrence of condition will improve, Ability to verbalize feelings will improve, Ability to disclose and discuss suicidal ideas, Ability to demonstrate self-control will improve, Ability to identify and develop effective coping behaviors will improve, Ability to maintain clinical measurements within normal limits will improve, Compliance with prescribed medications will improve, and Ability to identify triggers associated with substance abuse/mental health issues will improve  Physician Treatment Plan for Secondary Diagnosis: Principal Problem:   Major depressive disorder, recurrent severe without psychotic features (HCC) Active Problems:   Acetaminophen overdose   Alcohol abuse with intoxication (HCC)   Suicidal overdose, initial encounter (HCC)   Alcohol dependence (HCC)  Long Term Goal(s): Improvement in symptoms so as ready for discharge  Short Term Goals: Ability to identify changes in lifestyle to reduce recurrence of condition will improve, Ability  to verbalize feelings will improve, Ability to disclose and discuss suicidal ideas, Ability to demonstrate self-control will improve, Ability to identify and develop effective coping behaviors will improve, Ability to maintain clinical measurements within normal limits will improve, Compliance with prescribed medications will improve, and Ability to identify triggers associated with substance abuse/mental health issues will improve  I certify that inpatient services furnished can reasonably be expected to improve the patient's condition.    Nanine Means, NP 8/13/20245:35 PM
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
## 2023-07-17 NOTE — Group Note (Signed)
LCSW Group Therapy Note  Group Date: 07/17/2023 Start Time: 1330 End Time: 1430   Type of Therapy and Topic:  Group Therapy: Values  Participation Level:  Active   Description of Group:   This group addressed values in self, friends, family and society.  Patients went around the room and identified their core values.  Group discussion centered on how these values impact our wellbeing.  Patients reflected on how it felt to share values and how there can be similarities and differences in shared values.  Patients were encouraged to have a daily reflection of positive characteristics or circumstances.  Therapeutic Goals: Patients will verbalize five of their values  Patients will verbalize their feelings when voicing values within the different systems to include "myself, family, friends, society"  Patients will discuss the potential positive impact on their wellness/recovery of focusing on positive traits of self and others.  Summary of Patient Progress:   Patient was actively engaged in the discussion and . She was able to identify positive affirmations about self as well as other group members. Patient demonstrated fair insight into the subject matter, was respectful of peers, participated throughout the entire session.  Therapeutic Modalities:   Cognitive Behavioral Therapy Motivational Interviewing    Harden Mo, [place] 07/17/2023  3:05 PM

## 2023-07-17 NOTE — Progress Notes (Signed)
Pt calm and pleasant during assessment denying SI/HI/AVH. Pt observed by this Clinical research associate interacting appropriately with staff and peers on the unit. Pt refused her night time medication, check MAR. Pt given education, still refused. Pt given education, support, and encouragement to be active in her treatment plan. Pt being monitored Q 15 minutes for safety per unit protocol, remains safe on the unit

## 2023-07-17 NOTE — Plan of Care (Signed)
Patient observed interacting appropriately with staff and peers on the unit. Pt refused medication tonight, stating that she didn't need it. Pt given education, still refused. Check MAR  Problem: Education: Goal: Knowledge of General Education information will improve Description: Including pain rating scale, medication(s)/side effects and non-pharmacologic comfort measures Outcome: Progressing   Problem: Health Behavior/Discharge Planning: Goal: Ability to manage health-related needs will improve Outcome: Progressing   Problem: Clinical Measurements: Goal: Ability to maintain clinical measurements within normal limits will improve Outcome: Progressing Goal: Will remain free from infection Outcome: Progressing Goal: Diagnostic test results will improve Outcome: Progressing Goal: Respiratory complications will improve Outcome: Progressing Goal: Cardiovascular complication will be avoided Outcome: Progressing   Problem: Activity: Goal: Risk for activity intolerance will decrease Outcome: Progressing   Problem: Nutrition: Goal: Adequate nutrition will be maintained Outcome: Progressing   Problem: Coping: Goal: Level of anxiety will decrease Outcome: Progressing   Problem: Elimination: Goal: Will not experience complications related to bowel motility Outcome: Progressing Goal: Will not experience complications related to urinary retention Outcome: Progressing   Problem: Pain Managment: Goal: General experience of comfort will improve Outcome: Progressing   Problem: Safety: Goal: Ability to remain free from injury will improve Outcome: Progressing   Problem: Skin Integrity: Goal: Risk for impaired skin integrity will decrease Outcome: Progressing   Problem: Education: Goal: Knowledge of [place] [facility] Education information/materials will improve Outcome: Progressing Goal: Emotional status will improve Outcome: Progressing Goal: Mental status will  improve Outcome: Progressing Goal: Verbalization of understanding the information provided will improve Outcome: Progressing   Problem: Activity: Goal: Interest or engagement in activities will improve Outcome: Progressing Goal: Sleeping patterns will improve Outcome: Progressing   Problem: Coping: Goal: Ability to verbalize frustrations and anger appropriately will improve Outcome: Progressing Goal: Ability to demonstrate self-control will improve Outcome: Progressing   Problem: Health Behavior/Discharge Planning: Goal: Identification of resources available to assist in meeting health care needs will improve Outcome: Progressing Goal: Compliance with treatment plan for underlying cause of condition will improve Outcome: Progressing   Problem: Physical Regulation: Goal: Ability to maintain clinical measurements within normal limits will improve Outcome: Progressing   Problem: Safety: Goal: Periods of time without injury will increase Outcome: Progressing

## 2023-07-17 NOTE — Group Note (Signed)
Date:  07/17/2023 Time:  8:23 PM  Group Topic/Focus:  Dimensions of Wellness:   The focus of this group is to introduce the topic of wellness and discuss the role each dimension of wellness plays in total health.    Participation Level:  Active  Participation Quality:  Appropriate  Affect:  Appropriate  Cognitive:  Appropriate  Insight: Good  Engagement in Group:  Engaged  Modes of Intervention:  Activity and Discussion  Additional Comments:    Osker Mason 07/17/2023, 8:23 PM

## 2023-07-17 NOTE — Group Note (Signed)
Date:  07/17/2023 Time:  4:00 PM  Group Topic/Focus: Helps provide  a way to express emotions and experiences not easily expressed in words .It is about healing through the process of Art, including music , writing and crosswords puzzles. Healing through Art.     Participation Level:  Active  Participation Quality:  Appropriate, Attentive, Sharing, and Supportive  Affect:  Appropriate and Excited  Cognitive:  Alert, Appropriate, and Oriented  Insight: Appropriate  Engagement in Group:  Developing/Improving  Modes of Intervention:  Activity, Rapport Building, Role-play, and Socialization  Additional Comments:    Rosaura Carpenter 07/17/2023, 4:00 PM
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
## 2023-07-18 DIAGNOSIS — F332 Major depressive disorder, recurrent severe without psychotic features: Secondary | ICD-10-CM | POA: Diagnosis not present

## 2023-07-18 NOTE — Plan of Care (Signed)
Patient compliant with procedures on the unit, denies SI/HI/AVH, ready to D/C on Friday   Problem: Education: Goal: Knowledge of General Education information will improve Description: Including pain rating scale, medication(s)/side effects and non-pharmacologic comfort measures Outcome: Progressing   Problem: Health Behavior/Discharge Planning: Goal: Ability to manage health-related needs will improve Outcome: Progressing   Problem: Clinical Measurements: Goal: Ability to maintain clinical measurements within normal limits will improve Outcome: Progressing Goal: Will remain free from infection Outcome: Progressing Goal: Diagnostic test results will improve Outcome: Progressing Goal: Respiratory complications will improve Outcome: Progressing Goal: Cardiovascular complication will be avoided Outcome: Progressing   Problem: Activity: Goal: Risk for activity intolerance will decrease Outcome: Progressing   Problem: Nutrition: Goal: Adequate nutrition will be maintained Outcome: Progressing   Problem: Coping: Goal: Level of anxiety will decrease Outcome: Progressing   Problem: Elimination: Goal: Will not experience complications related to bowel motility Outcome: Progressing Goal: Will not experience complications related to urinary retention Outcome: Progressing   Problem: Pain Managment: Goal: General experience of comfort will improve Outcome: Progressing   Problem: Safety: Goal: Ability to remain free from injury will improve Outcome: Progressing   Problem: Skin Integrity: Goal: Risk for impaired skin integrity will decrease Outcome: Progressing   Problem: Education: Goal: Knowledge of [place] [facility] Education information/materials will improve Outcome: Progressing Goal: Emotional status will improve Outcome: Progressing Goal: Mental status will improve Outcome: Progressing Goal: Verbalization of understanding the information provided will  improve Outcome: Progressing   Problem: Activity: Goal: Interest or engagement in activities will improve Outcome: Progressing Goal: Sleeping patterns will improve Outcome: Progressing   Problem: Coping: Goal: Ability to verbalize frustrations and anger appropriately will improve Outcome: Progressing Goal: Ability to demonstrate self-control will improve Outcome: Progressing   Problem: Health Behavior/Discharge Planning: Goal: Identification of resources available to assist in meeting health care needs will improve Outcome: Progressing Goal: Compliance with treatment plan for underlying cause of condition will improve Outcome: Progressing   Problem: Physical Regulation: Goal: Ability to maintain clinical measurements within normal limits will improve Outcome: Progressing   Problem: Safety: Goal: Periods of time without injury will increase Outcome: Progressing

## 2023-07-18 NOTE — BH IP Treatment Plan (Signed)
Interdisciplinary Treatment and Diagnostic Plan Update  07/18/2023 Time of Session: 09:48 Mikayla Glover MRN: [id number]  Principal Diagnosis: Major depressive disorder, recurrent severe without psychotic features (HCC)  Secondary Diagnoses: Principal Problem:   Major depressive disorder, recurrent severe without psychotic features (HCC) Active Problems:   Acetaminophen overdose   Alcohol abuse with intoxication (HCC)   Suicidal overdose, initial encounter (HCC)   Alcohol dependence (HCC)   Current Medications:  Current Facility-Administered Medications  Medication Dose Route Frequency Provider Last Rate Last Admin   acetaminophen (TYLENOL) tablet 650 mg  650 mg Oral Q6H PRN Starkes-Perry, Juel Burrow, FNP       alum & mag hydroxide-simeth (MAALOX/MYLANTA) 200-200-20 MG/5ML suspension 30 mL  30 mL Oral Q4H PRN Starkes-Perry, Juel Burrow, FNP       diphenhydrAMINE (BENADRYL) capsule 50 mg  50 mg Oral TID PRN Maryagnes Amos, FNP       Or   diphenhydrAMINE (BENADRYL) injection 50 mg  50 mg Intramuscular TID PRN Starkes-Perry, Juel Burrow, FNP       haloperidol (HALDOL) tablet 5 mg  5 mg Oral TID PRN Maryagnes Amos, FNP       Or   haloperidol lactate (HALDOL) injection 5 mg  5 mg Intramuscular TID PRN Starkes-Perry, Juel Burrow, FNP       hydrOXYzine (ATARAX) tablet 25 mg  25 mg Oral Q6H PRN Charm Rings, NP       loperamide (IMODIUM) capsule 2-4 mg  2-4 mg Oral PRN Charm Rings, NP       LORazepam (ATIVAN) tablet 2 mg  2 mg Oral TID PRN Maryagnes Amos, FNP       Or   LORazepam (ATIVAN) injection 2 mg  2 mg Intramuscular TID PRN Starkes-Perry, Juel Burrow, FNP       LORazepam (ATIVAN) tablet 1 mg  1 mg Oral Q6H PRN Charm Rings, NP       LORazepam (ATIVAN) tablet 1 mg  1 mg Oral TID Charm Rings, NP       Followed by   Melene Muller ON 07/19/2023] LORazepam (ATIVAN) tablet 1 mg  1 mg Oral BID Charm Rings, NP       Followed by   Melene Muller ON 07/20/2023] LORazepam (ATIVAN) tablet  1 mg  1 mg Oral Daily Lord, Jamison Y, NP       magnesium hydroxide (MILK OF MAGNESIA) suspension 30 mL  30 mL Oral Daily PRN Starkes-Perry, Juel Burrow, FNP       multivitamin with minerals tablet 1 tablet  1 tablet Oral Daily Charm Rings, NP   1 tablet at 07/18/23 1031   ondansetron (ZOFRAN-ODT) disintegrating tablet 4 mg  4 mg Oral Q6H PRN Charm Rings, NP       thiamine (VITAMIN B1) injection 100 mg  100 mg Intramuscular Once Charm Rings, NP       thiamine (VITAMIN B1) tablet 100 mg  100 mg Oral Daily Charm Rings, NP   100 mg at 07/18/23 1032   PTA Medications: No medications prior to admission.    Patient Stressors:    Patient Strengths:    Treatment Modalities: Medication Management, Group therapy, Case management,  1 to 1 session with clinician, Psychoeducation, Recreational therapy.   Physician Treatment Plan for Primary Diagnosis: Major depressive disorder, recurrent severe without psychotic features (HCC) Long Term Goal(s): Improvement in symptoms so as ready for discharge   Short Term Goals: Ability to identify changes in  lifestyle to reduce recurrence of condition will improve Ability to verbalize feelings will improve Ability to disclose and discuss suicidal ideas Ability to demonstrate self-control will improve Ability to identify and develop effective coping behaviors will improve Ability to maintain clinical measurements within normal limits will improve Compliance with prescribed medications will improve Ability to identify triggers associated with substance abuse/mental health issues will improve  Medication Management: Evaluate patient's response, side effects, and tolerance of medication regimen.  Therapeutic Interventions: 1 to 1 sessions, Unit Group sessions and Medication administration.  Evaluation of Outcomes: Progressing  Physician Treatment Plan for Secondary Diagnosis: Principal Problem:   Major depressive disorder, recurrent severe without  psychotic features (HCC) Active Problems:   Acetaminophen overdose   Alcohol abuse with intoxication (HCC)   Suicidal overdose, initial encounter (HCC)   Alcohol dependence (HCC)  Long Term Goal(s): Improvement in symptoms so as ready for discharge   Short Term Goals: Ability to identify changes in lifestyle to reduce recurrence of condition will improve Ability to verbalize feelings will improve Ability to disclose and discuss suicidal ideas Ability to demonstrate self-control will improve Ability to identify and develop effective coping behaviors will improve Ability to maintain clinical measurements within normal limits will improve Compliance with prescribed medications will improve Ability to identify triggers associated with substance abuse/mental health issues will improve     Medication Management: Evaluate patient's response, side effects, and tolerance of medication regimen.  Therapeutic Interventions: 1 to 1 sessions, Unit Group sessions and Medication administration.  Evaluation of Outcomes: Progressing   RN Treatment Plan for Primary Diagnosis: Major depressive disorder, recurrent severe without psychotic features (HCC) Long Term Goal(s): Knowledge of disease and therapeutic regimen to maintain health will improve  Short Term Goals: Ability to remain free from injury will improve, Ability to verbalize frustration and anger appropriately will improve, Ability to demonstrate self-control, Ability to participate in decision making will improve, Ability to verbalize feelings will improve, Ability to disclose and discuss suicidal ideas, Ability to identify and develop effective coping behaviors will improve, and Compliance with prescribed medications will improve  Medication Management: RN will administer medications as ordered by provider, will assess and evaluate patient's response and provide education to patient for prescribed medication. RN will report any adverse and/or  side effects to prescribing provider.  Therapeutic Interventions: 1 on 1 counseling sessions, Psychoeducation, Medication administration, Evaluate responses to treatment, Monitor vital signs and CBGs as ordered, Perform/monitor CIWA, COWS, AIMS and Fall Risk screenings as ordered, Perform wound care treatments as ordered.  Evaluation of Outcomes: Progressing   LCSW Treatment Plan for Primary Diagnosis: Major depressive disorder, recurrent severe without psychotic features (HCC) Long Term Goal(s): Safe transition to appropriate next level of care at discharge, Engage patient in therapeutic group addressing interpersonal concerns.  Short Term Goals: Engage patient in aftercare planning with referrals and resources, Increase social support, Increase ability to appropriately verbalize feelings, Increase emotional regulation, Facilitate acceptance of mental health diagnosis and concerns, Facilitate patient progression through stages of change regarding substance use diagnoses and concerns, Identify triggers associated with mental health/substance abuse issues, and Increase skills for wellness and recovery  Therapeutic Interventions: Assess for all discharge needs, 1 to 1 time with Social worker, Explore available resources and support systems, Assess for adequacy in community support network, Educate family and significant other(s) on suicide prevention, Complete Psychosocial Assessment, Interpersonal group therapy.  Evaluation of Outcomes: Progressing   Progress in Treatment: Attending groups: Yes. Participating in groups: Yes. Taking medication as prescribed: Yes.  Toleration medication: Yes. Family/Significant other contact made: No, will contact:  partner. Patient understands diagnosis: Yes. Discussing patient identified problems/goals with staff: Yes. Medical problems stabilized or resolved: Yes. Denies suicidal/homicidal ideation: Yes. Issues/concerns per patient self-inventory:  No. Other: none.  New problem(s) identified: No, Describe:  none identified.  New Short Term/Long Term Goal(s): medication management for mood stabilization; elimination of SI thoughts; development of comprehensive mental wellness/sobriety plan.  Patient Goals:  "Talk with somebody...express myself more."  Discharge Plan or Barriers: CSW will assist pt with development of an appropriate aftercare/discharge plans.   Reason for Continuation of Hospitalization: Depression Medication stabilization Suicidal ideation Withdrawal symptoms  Estimated Length of Stay: 1-7 days  Last 3 [place] Suicide Severity Risk Score: Flowsheet Row Admission (Current) from 07/16/2023 in [facility] ED to Hosp-Admission (Discharged) from 07/14/2023 in [facility] from 06/11/2022 in [facility] Department at [facility]  C-SSRS RISK CATEGORY Low Risk No Risk No Risk       Last PHQ 2/9 Scores:     No data to display          Scribe for Treatment Team: Glenis Smoker, LCSW 07/18/2023 1:54 PM

## 2023-07-18 NOTE — Group Note (Signed)
Date:  07/18/2023 Time:  11:18 PM  Group Topic/Focus:  Personal Choices and Values:   The focus of this group is to help patients assess and explore the importance of values in their lives, how their values affect their decisions, how they express their values and what opposes their expression.    Participation Level:  Did Not Attend    Additional Comments:     Lenore Cordia 07/18/2023, 11:18 PM

## 2023-07-18 NOTE — Group Note (Signed)
Recreation Therapy Group Note   Group Topic:Goal Setting  Group Date: 07/18/2023 Start Time: 1005 End Time: 1105 Facilitators: Rosina Lowenstein, LRT, CTRS Location:  Day room  Group Description: Vision Board. Patients were given many different magazines, a glue stick, markers, and a piece of cardstock paper. LRT and pts discussed the importance of having goals in life. LRT and pts discussed the difference between short-term and long-term goals, as well as what a SMART goal is. LRT encouraged pts to create a vision board, with images they picked and then cut out with safety scissors from the magazine, for themselves, that capture their short and long-term goals. LRT encouraged pts to show and explain their vision board to the group. LRT offered to laminate vision board once dry and complete.   Goal Area(s) Addressed:  Patient will gain knowledge of short vs. long term goals.  Patient will identify goals for themselves. Patient will practice setting SMART goals. Patient will verbalize their goals to LRT and peers.   Affect/Mood: N/A   Participation Level: Did not attend    Clinical Observations/Individualized Feedback: Mikayla Glover did not attend group.   Plan: Continue to engage patient in RT group sessions 2-3x/week.   Rosina Lowenstein, LRT, CTRS 07/18/2023 11:37 AM

## 2023-07-18 NOTE — Progress Notes (Signed)
[facility] MD Progress Note  07/18/2023 3:47 PM Mikayla Glover  MRN:  [id number]  Subjective:   27 yo female admitted after drinking excessively and taking an overdose that she cannot remember.  Today, she continues to deny depression with a low anxiety level related to desire to discharge.  Her goal voiced in treatment team was to get therapy to learn how to express herself better.  She denied issues with sleeping and eating.  Continued to decline medications including the Ativan protocol as she has not had any withdrawal symptoms.  She was disappointed about having to wait until Friday to discharge.  Encouraged her to work on her communication with others and work on acquiring better coping skills.  Principal Problem: Major depressive disorder, recurrent severe without psychotic features (HCC) Diagnosis: Principal Problem:   Major depressive disorder, recurrent severe without psychotic features (HCC) Active Problems:   Acetaminophen overdose   Alcohol abuse with intoxication (HCC)   Suicidal overdose, initial encounter (HCC)   Alcohol dependence (HCC)  Total Time spent with patient: 30 minutes  Past Psychiatric History: alcohol abuse, depression, anxiety  Past Medical History:  Past Medical History:  Diagnosis Date   ETOH abuse    started drinking at age 90-15    History reviewed. No pertinent surgical history. Family History: History reviewed. No pertinent family history. Family Psychiatric  History: mother with alcohol use disorder Social History:  Social History   Substance and Sexual Activity  Alcohol Use Yes   Comment: weekends     Social History   Substance and Sexual Activity  Drug Use Not Currently   Types: Marijuana    Social History   Socioeconomic History   Marital status: Single    Spouse name: Not on file   Number of children: Not on file   Years of education: Not on file   Highest education level: Not on file  Occupational History   Not on file  Tobacco Use    Smoking status: Every Day    Current packs/day: 0.50    Types: Cigarettes, Cigars   Smokeless tobacco: Never  Vaping Use   Vaping status: Never Used  Substance and Sexual Activity   Alcohol use: Yes    Comment: weekends   Drug use: Not Currently    Types: Marijuana   Sexual activity: Not on file  Other Topics Concern   Not on file  Social History Narrative   Not on file   Social Determinants of Health   Financial Resource Strain: Not on file  Food Insecurity: No Food Insecurity (07/16/2023)   Hunger Vital Sign    Worried About Running Out of Food in the Last Year: Never true    Ran Out of Food in the Last Year: Never true  Recent Concern: Food Insecurity - Food Insecurity Present (07/15/2023)   Hunger Vital Sign    Worried About Running Out of Food in the Last Year: Never true    Ran Out of Food in the Last Year: Often true  Transportation Needs: No Transportation Needs (07/16/2023)   PRAPARE - Administrator, Civil Service (Medical): No    Lack of Transportation (Non-Medical): No  Recent Concern: Transportation Needs - Unmet Transportation Needs (07/15/2023)   PRAPARE - Administrator, Civil Service (Medical): Yes    Lack of Transportation (Non-Medical): Yes  Physical Activity: Not on file  Stress: Not on file  Social Connections: Not on file   Additional Social History: lives predominately with  her girlfriend     Sleep: Good  Appetite:  Good  Current Medications: Current Facility-Administered Medications  Medication Dose Route Frequency Provider Last Rate Last Admin   acetaminophen (TYLENOL) tablet 650 mg  650 mg Oral Q6H PRN Starkes-Perry, Juel Burrow, FNP       alum & mag hydroxide-simeth (MAALOX/MYLANTA) 200-200-20 MG/5ML suspension 30 mL  30 mL Oral Q4H PRN Starkes-Perry, Juel Burrow, FNP       diphenhydrAMINE (BENADRYL) capsule 50 mg  50 mg Oral TID PRN Maryagnes Amos, FNP       Or   diphenhydrAMINE (BENADRYL) injection 50 mg  50 mg  Intramuscular TID PRN Starkes-Perry, Juel Burrow, FNP       haloperidol (HALDOL) tablet 5 mg  5 mg Oral TID PRN Maryagnes Amos, FNP       Or   haloperidol lactate (HALDOL) injection 5 mg  5 mg Intramuscular TID PRN Starkes-Perry, Juel Burrow, FNP       hydrOXYzine (ATARAX) tablet 25 mg  25 mg Oral Q6H PRN Charm Rings, NP       loperamide (IMODIUM) capsule 2-4 mg  2-4 mg Oral PRN Charm Rings, NP       LORazepam (ATIVAN) tablet 2 mg  2 mg Oral TID PRN Maryagnes Amos, FNP       Or   LORazepam (ATIVAN) injection 2 mg  2 mg Intramuscular TID PRN Rosario Adie, Juel Burrow, FNP       LORazepam (ATIVAN) tablet 1 mg  1 mg Oral Q6H PRN Charm Rings, NP       LORazepam (ATIVAN) tablet 1 mg  1 mg Oral TID Charm Rings, NP       Followed by   Melene Muller ON 07/19/2023] LORazepam (ATIVAN) tablet 1 mg  1 mg Oral BID Charm Rings, NP       Followed by   Melene Muller ON 07/20/2023] LORazepam (ATIVAN) tablet 1 mg  1 mg Oral Daily Antoria Lanza Y, NP       magnesium hydroxide (MILK OF MAGNESIA) suspension 30 mL  30 mL Oral Daily PRN Starkes-Perry, Juel Burrow, FNP       multivitamin with minerals tablet 1 tablet  1 tablet Oral Daily Charm Rings, NP   1 tablet at 07/18/23 1031   ondansetron (ZOFRAN-ODT) disintegrating tablet 4 mg  4 mg Oral Q6H PRN Charm Rings, NP       thiamine (VITAMIN B1) injection 100 mg  100 mg Intramuscular Once Charm Rings, NP       thiamine (VITAMIN B1) tablet 100 mg  100 mg Oral Daily Charm Rings, NP   100 mg at 07/18/23 1032    Lab Results: No results found for this or any previous visit (from the past 48 hour(s)).  Blood Alcohol level:  Lab Results  Component Value Date   ETH 285 (H) 07/14/2023   ETH 135 (H) 12/05/2018    Metabolic Disorder Labs: No results found for: "HGBA1C", "MPG" No results found for: "PROLACTIN" No results found for: "CHOL", "TRIG", "HDL", "CHOLHDL", "VLDL", "LDLCALC"  Physical Findings: AIMS:  , ,  ,  ,    CIWA:  CIWA-Ar Total:  0 COWS:     Musculoskeletal: Strength & Muscle Tone: within normal limits Gait & Station: normal Patient leans: N/A  Psychiatric Specialty Exam: Physical Exam Vitals and nursing note reviewed.  Constitutional:      Appearance: Normal appearance.  HENT:     Head:  Normocephalic.     Nose: Nose normal.  Pulmonary:     Effort: Pulmonary effort is normal.  Musculoskeletal:        General: Normal range of motion.     Cervical back: Normal range of motion.  Neurological:     General: No focal deficit present.     Mental Status: She is alert and oriented to person, place, and time.  Psychiatric:        Attention and Perception: Attention and perception normal.        Mood and Affect: Mood is anxious.        Speech: Speech normal.        Behavior: Behavior normal. Behavior is cooperative.        Thought Content: Thought content normal.        Cognition and Memory: Cognition and memory normal.        Judgment: Judgment is impulsive.     Review of Systems  Psychiatric/Behavioral:  Positive for substance abuse. The patient is nervous/anxious.   All other systems reviewed and are negative.   Blood pressure 135/86, pulse 84, temperature (!) 97 F (36.1 C), resp. rate 15, height 5\' 6"  (1.676 m), weight 81.2 kg, SpO2 100%.Body mass index is 28.89 kg/m.  General Appearance: Casual  Eye Contact:  Good  Speech:  Normal Rate  Volume:  Normal  Mood:  anxiety, mild  Affect:  Congruent  Thought Process:  Coherent  Orientation:  Full (Time, Place, and Person)  Thought Content:  Logical  Suicidal Thoughts:  No  Homicidal Thoughts:  No  Memory:  Immediate;   Good Recent;   Good Remote;   Good  Judgement:  Fair  Insight:  Fair  Psychomotor Activity:  Normal  Concentration:  Concentration: Fair and Attention Span: Fair  Recall:  Fair  Fund of Knowledge:  Good  Language:  Good  Akathisia:  No  Handed:  Right  AIMS (if indicated):     Assets:  Housing Leisure Time Physical  Health Resilience Social Support  ADL's:  Intact  Cognition:  WNL  Sleep:         Physical Exam: Physical Exam Vitals and nursing note reviewed.  Constitutional:      Appearance: Normal appearance.  HENT:     Head: Normocephalic.     Nose: Nose normal.  Pulmonary:     Effort: Pulmonary effort is normal.  Musculoskeletal:        General: Normal range of motion.     Cervical back: Normal range of motion.  Neurological:     General: No focal deficit present.     Mental Status: She is alert and oriented to person, place, and time.  Psychiatric:        Attention and Perception: Attention and perception normal.        Mood and Affect: Mood is anxious.        Speech: Speech normal.        Behavior: Behavior normal. Behavior is cooperative.        Thought Content: Thought content normal.        Cognition and Memory: Cognition and memory normal.        Judgment: Judgment is impulsive.    Review of Systems  Psychiatric/Behavioral:  Positive for substance abuse. The patient is nervous/anxious.   All other systems reviewed and are negative.  Blood pressure 135/86, pulse 84, temperature (!) 97 F (36.1 C), resp. rate 15, height 5\' 6"  (1.676 m), weight  81.2 kg, SpO2 100%. Body mass index is 28.89 kg/m.  Treatment Plan Summary: Daily contact with patient to assess and evaluate symptoms and progress in treatment, Medication management, and Plan : Major depressive disorder, recurrent, severe without psychosis: Declined medications Group therapy   Alcohol Abuse: CIWA protocol in place, denies withdrawal symptoms and declined Ativan coverage, protocol discontinued with PRN Ativan if needed.  Nanine Means, NP 07/18/2023, 3:47 PM

## 2023-07-18 NOTE — Plan of Care (Signed)
  Problem: Education: Goal: Knowledge of General Education information will improve Description: Including pain rating scale, medication(s)/side effects and non-pharmacologic comfort measures Outcome: Progressing   Problem: Health Behavior/Discharge Planning: Goal: Ability to manage health-related needs will improve Outcome: Progressing   Problem: Clinical Measurements: Goal: Ability to maintain clinical measurements within normal limits will improve Outcome: Progressing Goal: Will remain free from infection Outcome: Progressing Goal: Diagnostic test results will improve Outcome: Progressing Goal: Respiratory complications will improve Outcome: Progressing Goal: Cardiovascular complication will be avoided Outcome: Progressing   Problem: Activity: Goal: Risk for activity intolerance will decrease Outcome: Progressing   Problem: Nutrition: Goal: Adequate nutrition will be maintained Outcome: Progressing   Problem: Coping: Goal: Level of anxiety will decrease Outcome: Progressing   Problem: Elimination: Goal: Will not experience complications related to bowel motility Outcome: Progressing Goal: Will not experience complications related to urinary retention Outcome: Progressing   Problem: Pain Managment: Goal: General experience of comfort will improve Outcome: Progressing   Problem: Safety: Goal: Ability to remain free from injury will improve Outcome: Progressing   Problem: Skin Integrity: Goal: Risk for impaired skin integrity will decrease Outcome: Progressing   Problem: Education: Goal: Knowledge of [facility] Education information/materials will improve Outcome: Progressing Goal: Emotional status will improve Outcome: Progressing Goal: Mental status will improve Outcome: Progressing Goal: Verbalization of understanding the information provided will improve Outcome: Progressing   Problem: Activity: Goal: Interest or engagement in activities will  improve Outcome: Progressing Goal: Sleeping patterns will improve Outcome: Progressing   Problem: Coping: Goal: Ability to verbalize frustrations and anger appropriately will improve Outcome: Progressing Goal: Ability to demonstrate self-control will improve Outcome: Progressing   Problem: Health Behavior/Discharge Planning: Goal: Identification of resources available to assist in meeting health care needs will improve Outcome: Progressing Goal: Compliance with treatment plan for underlying cause of condition will improve Outcome: Progressing   Problem: Physical Regulation: Goal: Ability to maintain clinical measurements within normal limits will improve Outcome: Progressing   Problem: Safety: Goal: Periods of time without injury will increase Outcome: Progressing   

## 2023-07-18 NOTE — Group Note (Signed)
Date:  07/18/2023 Time:  10:59 PM  Group Topic/Focus:  Personal Choices and Values:   The focus of this group is to help patients assess and explore the importance of values in their lives, how their values affect their decisions, how they express their values and what opposes their expression.    Participation Level:  Did Not Attend   Lenore Cordia 07/18/2023, 10:59 PM

## 2023-07-18 NOTE — Progress Notes (Signed)
D- Patient alert and oriented x 4. Affect bright /mood euthymic. Denies SI/ HI/ AVH. Patient denies pain. Patient endorses depression and anxiety. She denies withdrawal from ETOH. She refuses Ativan. She states that she is "going home Friday". A- Scheduled Ativan refused. Support and encouragement provided.  Routine safety checks conducted every 15 minutes without incident.  Patient informed to notify staff with problems or concerns and verbalizes understanding. R- No adverse drug reactions noted.  Patient compliant with medications and treatment plan. Patient receptive, calm cooperative and interacts well with others on the unit.  Patient contracts for safety and  remains safe on the unit at this time.

## 2023-07-18 NOTE — Group Note (Signed)
Date:  07/18/2023 Time:  1:27 PM  Group Topic/Focus:  Goals Group:   The focus of this group is to help patients establish daily goals to achieve during treatment and discuss how the patient can incorporate goal setting into their daily lives to aide in recovery.  Community Group   Participation Level:  Active  Participation Quality:  Appropriate and Attentive  Affect:  Appropriate  Cognitive:  Appropriate  Insight: Appropriate  Engagement in Group:  Engaged  Modes of Intervention:  Discussion and Education  Additional Comments:    Elroy Schembri A Danyetta Gillham 07/18/2023, 1:27 PM

## 2023-07-19 DIAGNOSIS — F332 Major depressive disorder, recurrent severe without psychotic features: Secondary | ICD-10-CM | POA: Diagnosis not present

## 2023-07-19 NOTE — Progress Notes (Signed)
Pt calm and pleasant during assessment denying SI/HI/AVH. Pt observed by this Clinical research associate interacting appropriately with staff and peers on the unit. Pt didn't have any medications scheduled and hasn't requested anything PRN. Pt given education, support, and encouragement to be active in her treatment plan. Pt being monitored Q 15 minutes for safety per unit protocol, remains safe on the unit

## 2023-07-19 NOTE — Group Note (Signed)
Date:  07/19/2023 Time:  11:09 PM  Group Topic/Focus:  Developing a Wellness Toolbox:   The focus of this group is to help patients develop a "wellness toolbox" with skills and strategies to promote recovery upon discharge.    Participation Level:  Active  Participation Quality:  Appropriate  Affect:  Appropriate  Cognitive:  Appropriate  Insight: Good  Engagement in Group:  Engaged  Modes of Intervention:  Discussion  Additional Comments:    Lenore Cordia 07/19/2023, 11:09 PM

## 2023-07-19 NOTE — Group Note (Signed)
Recreation Therapy Group Note   Group Topic:Healthy Support Systems  Group Date: 07/19/2023 Start Time: 1005 End Time: 1100 Facilitators: Rosina Lowenstein, LRT, CTRS Location:  Craft Room  Group Description: Straw Bridge.  Patients were given 10 plastic drinking straws and an equal length of masking tape. Using the materials provided, patients were instructed to build a free-standing bridge-like structure to suspend an everyday item (ex: deck of cards) off the floor or table surface. All materials were required to be used in Secondary school teacher. LRT facilitated post-activity discussion reviewing the importance of having strong and healthy support systems in our lives. LRT discussed how the people in our lives serve as the tape and the deck of cards we placed on top of our straw structure are the stressors we face in daily life. LRT and pts discussed what happens in our life when things get too heavy for [place], and we don't have strong supports outside of the hospital. Pt shared 2 of their healthy supports aloud in the group.    Goal Area(s) Addressed:  Patient will identify 2 healthy supports in their life. Patient will identify skills to successfully complete activity. Patient will identify correlation of this activity to life post-discharge.  Patient will work on Product manager.    Affect/Mood: Appropriate   Participation Level: Active and Engaged   Participation Quality: Independent   Behavior: Calm, cooperative and appropriate    Speech/Thought Process: Coherent   Insight: Good   Judgement: Good   Modes of Intervention: Activity   Patient Response to Interventions:  Attentive   Education Outcome:  Acknowledges education   Clinical Observations/Individualized Feedback: Mikayla Glover was active in their participation of session activities and group discussion. Pt identified "my girlfriend and myself" as her healthy supports. Pt successfully completed activity using all provided  materials. Pt interacted well with LRT and peers duration of session.    Plan: Continue to engage patient in RT group sessions 2-3x/week.   Rosina Lowenstein, LRT, CTRS 07/19/2023 11:13 AM

## 2023-07-19 NOTE — Progress Notes (Signed)
[facility] MD Progress Note  07/19/2023 10:34 AM Mikayla Glover  MRN:  [id number]  Subjective:  Pt chart reviewed, discussed with interdisciplinary team, and seen on rounds. Pt endorses euthymic mood. Denies suicidal, homicidal ideations. Denies auditory visual hallucinations or paranoia. Verbalizes looking forward to discharging tomorrow. States she will be returning to girlfriend's home. Feels psychiatrically stable. Reports appetite and sleep have been good.   Pt gave verbal consent to speak with her girlfriend, Demetrio Lapping, and provided phone number, [phone]. Demetrio Lapping called with pt present. Demetrio Lapping confirms pt will be living with her upon discharge. Denies safety concerns with discharge. States there is no firearm in the home. Safety planning completed with Demetrio Lapping, including: Frequent conversations regarding unsafe thoughts. Locking/monitoring the use of all significant sharps, including knives, razor blades, pencil sharpener razors. If there is a firearm in the home, keeping the firearm unloaded, locking the firearm, locking the ammunition separately from the firearm, preventing access to the firearm and the ammunition. Locking/monitoring the use of medications, including over-the-counter medications and supplements. Having a responsible person dispense medications until patient has strengthened coping skills. Room checks for sharps or other harmful objects. Secure all chemical substances that can be ingested or inhaled. Securing any ligature risks. Calling 911/EMS or going to the nearest emergency room for any worsening of condition.   Principal Problem: Major depressive disorder, recurrent severe without psychotic features (HCC) Diagnosis: Principal Problem:   Major depressive disorder, recurrent severe without psychotic features (HCC) Active Problems:   Acetaminophen overdose   Alcohol abuse with intoxication (HCC)   Suicidal overdose, initial encounter (HCC)   Alcohol dependence (HCC)  Total Time spent with  patient:  35 minutes  Past Psychiatric History: alcohol abuse, depression, anxiety  Past Medical History:  Past Medical History:  Diagnosis Date   ETOH abuse    started drinking at age 17-15    History reviewed. No pertinent surgical history. Family History: History reviewed. No pertinent family history. Family Psychiatric  History: mother with alcohol use disorder Social History:  Social History   Substance and Sexual Activity  Alcohol Use Yes   Comment: weekends     Social History   Substance and Sexual Activity  Drug Use Not Currently   Types: Marijuana    Social History   Socioeconomic History   Marital status: Single    Spouse name: Not on file   Number of children: Not on file   Years of education: Not on file   Highest education level: Not on file  Occupational History   Not on file  Tobacco Use   Smoking status: Every Day    Current packs/day: 0.50    Types: Cigarettes, Cigars   Smokeless tobacco: Never  Vaping Use   Vaping status: Never Used  Substance and Sexual Activity   Alcohol use: Yes    Comment: weekends   Drug use: Not Currently    Types: Marijuana   Sexual activity: Not on file  Other Topics Concern   Not on file  Social History Narrative   Not on file   Social Determinants of Health   Financial Resource Strain: Not on file  Food Insecurity: No Food Insecurity (07/16/2023)   Hunger Vital Sign    Worried About Running Out of Food in the Last Year: Never true    Ran Out of Food in the Last Year: Never true  Recent Concern: Food Insecurity - Food Insecurity Present (07/15/2023)   Hunger Vital Sign    Worried About Running Out of  Food in the Last Year: Never true    Ran Out of Food in the Last Year: Often true  Transportation Needs: No Transportation Needs (07/16/2023)   PRAPARE - Administrator, Civil Service (Medical): No    Lack of Transportation (Non-Medical): No  Recent Concern: Transportation Needs - Unmet Transportation  Needs (07/15/2023)   PRAPARE - Administrator, Civil Service (Medical): Yes    Lack of Transportation (Non-Medical): Yes  Physical Activity: Not on file  Stress: Not on file  Social Connections: Not on file   Additional Social History:                         Sleep: Good  Appetite:  Good  Current Medications: Current Facility-Administered Medications  Medication Dose Route Frequency Provider Last Rate Last Admin   acetaminophen (TYLENOL) tablet 650 mg  650 mg Oral Q6H PRN Starkes-Perry, Juel Burrow, FNP       alum & mag hydroxide-simeth (MAALOX/MYLANTA) 200-200-20 MG/5ML suspension 30 mL  30 mL Oral Q4H PRN Starkes-Perry, Juel Burrow, FNP       diphenhydrAMINE (BENADRYL) capsule 50 mg  50 mg Oral TID PRN Maryagnes Amos, FNP       Or   diphenhydrAMINE (BENADRYL) injection 50 mg  50 mg Intramuscular TID PRN Starkes-Perry, Juel Burrow, FNP       haloperidol (HALDOL) tablet 5 mg  5 mg Oral TID PRN Maryagnes Amos, FNP       Or   haloperidol lactate (HALDOL) injection 5 mg  5 mg Intramuscular TID PRN Starkes-Perry, Juel Burrow, FNP       hydrOXYzine (ATARAX) tablet 25 mg  25 mg Oral Q6H PRN Charm Rings, NP       loperamide (IMODIUM) capsule 2-4 mg  2-4 mg Oral PRN Charm Rings, NP       LORazepam (ATIVAN) tablet 2 mg  2 mg Oral TID PRN Maryagnes Amos, FNP       Or   LORazepam (ATIVAN) injection 2 mg  2 mg Intramuscular TID PRN Starkes-Perry, Juel Burrow, FNP       LORazepam (ATIVAN) tablet 1 mg  1 mg Oral Q6H PRN Charm Rings, NP       magnesium hydroxide (MILK OF MAGNESIA) suspension 30 mL  30 mL Oral Daily PRN Starkes-Perry, Juel Burrow, FNP       multivitamin with minerals tablet 1 tablet  1 tablet Oral Daily Charm Rings, NP   1 tablet at 07/18/23 1031   ondansetron (ZOFRAN-ODT) disintegrating tablet 4 mg  4 mg Oral Q6H PRN Charm Rings, NP       thiamine (VITAMIN B1) tablet 100 mg  100 mg Oral Daily Charm Rings, NP   100 mg at 07/18/23 1032     Lab Results: No results found for this or any previous visit (from the past 48 hour(s)).  Blood Alcohol level:  Lab Results  Component Value Date   ETH 285 (H) 07/14/2023   ETH 135 (H) 12/05/2018    Metabolic Disorder Labs: No results found for: "HGBA1C", "MPG" No results found for: "PROLACTIN" No results found for: "CHOL", "TRIG", "HDL", "CHOLHDL", "VLDL", "LDLCALC"  Physical Findings: AIMS:  , ,  ,  ,    CIWA:  CIWA-Ar Total: 0 COWS:     Musculoskeletal: Strength & Muscle Tone: within normal limits Gait & Station: normal Patient leans: N/A  Psychiatric Specialty Exam:  Presentation  General Appearance:  Appropriate for Environment; Fairly Groomed  Eye Contact: Good  Speech: Clear and Coherent; Normal Rate  Speech Volume: Normal  Handedness: Right   Mood and Affect  Mood: Euthymic  Affect: Full Range   Thought Process  Thought Processes: Coherent; Goal Directed; Linear  Descriptions of Associations:Intact  Orientation:Full (Time, Place and Person)  Thought Content:Logical  Hallucinations:Hallucinations: None  Ideas of Reference:None  Suicidal Thoughts:Suicidal Thoughts: No  Homicidal Thoughts:Homicidal Thoughts: No   Sensorium  Memory: Immediate Good  Judgment: Intact  Insight: Present   Executive Functions  Concentration: Good  Attention Span: Good  Recall: Good  Fund of Knowledge: Good  Language: Good   Psychomotor Activity  Psychomotor Activity: Psychomotor Activity: Normal   Assets  Assets: Communication Skills; Desire for Improvement; Financial Resources/Insurance; Housing; Intimacy; Leisure Time; Physical Health; Resilience; Social Support   Sleep  Sleep: Sleep: Good    Physical Exam: Physical Exam Constitutional:      General: She is not in acute distress.    Appearance: She is not ill-appearing, toxic-appearing or diaphoretic.  Eyes:     General: No scleral icterus. Cardiovascular:      Rate and Rhythm: Normal rate.  Pulmonary:     Effort: Pulmonary effort is normal. No respiratory distress.  Neurological:     Mental Status: She is alert and oriented to person, place, and time.  Psychiatric:        Attention and Perception: Attention and perception normal.        Mood and Affect: Mood and affect normal.        Speech: Speech normal.        Behavior: Behavior normal. Behavior is cooperative.        Thought Content: Thought content normal.        Cognition and Memory: Cognition and memory normal.        Judgment: Judgment normal.    Review of Systems  Constitutional:  Negative for chills and fever.  Respiratory:  Negative for shortness of breath.   Cardiovascular:  Negative for chest pain and palpitations.  Gastrointestinal:  Negative for abdominal pain.  Neurological:  Negative for headaches.  Psychiatric/Behavioral:  Negative for depression, hallucinations, memory loss and suicidal ideas. The patient is not nervous/anxious and does not have insomnia.    Blood pressure 130/89, pulse 82, temperature 98.4 F (36.9 C), temperature source Oral, resp. rate 19, height 5\' 6"  (1.676 m), weight 81.2 kg, SpO2 100%. Body mass index is 28.89 kg/m.   Treatment Plan Summary: Daily contact with patient to assess and evaluate symptoms and progress in treatment, Medication management, and Plan    -no medication changes -expected discharge tomorrow  Lauree Chandler, NP 07/19/2023, 10:34 AM

## 2023-07-19 NOTE — Plan of Care (Signed)
Pt at baseline, ready to D/C tomorrow  Problem: Education: Goal: Knowledge of General Education information will improve Description: Including pain rating scale, medication(s)/side effects and non-pharmacologic comfort measures Outcome: Progressing   Problem: Health Behavior/Discharge Planning: Goal: Ability to manage health-related needs will improve Outcome: Progressing   Problem: Clinical Measurements: Goal: Ability to maintain clinical measurements within normal limits will improve Outcome: Progressing Goal: Will remain free from infection Outcome: Progressing Goal: Diagnostic test results will improve Outcome: Progressing Goal: Respiratory complications will improve Outcome: Progressing Goal: Cardiovascular complication will be avoided Outcome: Progressing   Problem: Activity: Goal: Risk for activity intolerance will decrease Outcome: Progressing   Problem: Nutrition: Goal: Adequate nutrition will be maintained Outcome: Progressing   Problem: Coping: Goal: Level of anxiety will decrease Outcome: Progressing   Problem: Elimination: Goal: Will not experience complications related to bowel motility Outcome: Progressing Goal: Will not experience complications related to urinary retention Outcome: Progressing   Problem: Pain Managment: Goal: General experience of comfort will improve Outcome: Progressing   Problem: Safety: Goal: Ability to remain free from injury will improve Outcome: Progressing   Problem: Skin Integrity: Goal: Risk for impaired skin integrity will decrease Outcome: Progressing   Problem: Education: Goal: Knowledge of [place] [facility] Education information/materials will improve Outcome: Progressing Goal: Emotional status will improve Outcome: Progressing Goal: Mental status will improve Outcome: Progressing Goal: Verbalization of understanding the information provided will improve Outcome: Progressing   Problem: Activity: Goal:  Interest or engagement in activities will improve Outcome: Progressing Goal: Sleeping patterns will improve Outcome: Progressing   Problem: Coping: Goal: Ability to verbalize frustrations and anger appropriately will improve Outcome: Progressing Goal: Ability to demonstrate self-control will improve Outcome: Progressing   Problem: Health Behavior/Discharge Planning: Goal: Identification of resources available to assist in meeting health care needs will improve Outcome: Progressing Goal: Compliance with treatment plan for underlying cause of condition will improve Outcome: Progressing   Problem: Physical Regulation: Goal: Ability to maintain clinical measurements within normal limits will improve Outcome: Progressing   Problem: Safety: Goal: Periods of time without injury will increase Outcome: Progressing

## 2023-07-19 NOTE — Group Note (Signed)
Date:  07/19/2023 Time:  11:44 AM  Group Topic/Focus:  Crisis Planning:   The purpose of this group is to help patients create a crisis plan for use upon discharge or in the future, as needed.    Participation Level:  Active  Participation Quality:  Appropriate and Attentive  Affect:  Appropriate  Cognitive:  Alert, Appropriate, and Oriented  Insight: Appropriate  Engagement in Group:  Developing/Improving and Engaged  Modes of Intervention:  Activity and Discussion  Additional Comments:    Dalylah Ramey 07/19/2023, 11:44 AM

## 2023-07-19 NOTE — Plan of Care (Signed)
Patient pleasant and cooperative on approach. Denies SI,HI and AVH. Patient refused to take vitamins that makes her stomach upset. Appetite and energy level good. Looking forward for discharge tomorrow. Support and encouragement given.

## 2023-07-19 NOTE — Group Note (Signed)
Date:  07/19/2023 Time:  4:12 PM  Group Topic/Focus:  Activity Group:  The focus of this group is to promote activity and wellness for patients to get some fresh air and also get some exercise.    Participation Level:  Active  Participation Quality:  Appropriate  Affect:  Appropriate  Cognitive:  Appropriate  Insight: Appropriate  Engagement in Group:  Engaged  Modes of Intervention:  Activity  Additional Comments:    Mikayla Glover [name] 07/19/2023, 4:12 PM
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
## 2023-07-20 DIAGNOSIS — F332 Major depressive disorder, recurrent severe without psychotic features: Secondary | ICD-10-CM | POA: Diagnosis not present

## 2023-07-20 NOTE — Discharge Summary (Signed)
Physician Discharge Summary Note  Patient:  Mikayla Glover is an 27 y.o., female MRN:  [id number] DOB:  22-Jun-1996 Patient phone:  [phone] (home)  Patient address:   [address] [place] [address],  Total Time spent with patient: 45 minutes  Date of Admission:  07/16/2023 Date of Discharge: 07/20/2023  Reason for Admission:  intentional overdose  Principal Problem: Major depressive disorder, recurrent severe without psychotic features [facility]) Discharge Diagnoses: Principal Problem:   Major depressive disorder, recurrent severe without psychotic features (HCC) Active Problems:   Acetaminophen overdose   Alcohol abuse with intoxication (HCC)   Suicidal overdose, initial encounter (HCC)   Alcohol dependence (HCC)   Past Psychiatric History: alcohol abuse  Past Medical History:  Past Medical History:  Diagnosis Date   ETOH abuse    started drinking at age 62-15    History reviewed. No pertinent surgical history. Family History: History reviewed. No pertinent family history. Family Psychiatric  History: mother with alcohol use d/o Social History:  Social History   Substance and Sexual Activity  Alcohol Use Yes   Comment: weekends     Social History   Substance and Sexual Activity  Drug Use Not Currently   Types: Marijuana    Social History   Socioeconomic History   Marital status: Single    Spouse name: Not on file   Number of children: Not on file   Years of education: Not on file   Highest education level: Not on file  Occupational History   Not on file  Tobacco Use   Smoking status: Every Day    Current packs/day: 0.50    Types: Cigarettes, Cigars   Smokeless tobacco: Never  Vaping Use   Vaping status: Never Used  Substance and Sexual Activity   Alcohol use: Yes    Comment: weekends   Drug use: Not Currently    Types: Marijuana   Sexual activity: Not on file  Other Topics Concern   Not on file  Social History Narrative   Not on file    Social Determinants of Health   Financial Resource Strain: Not on file  Food Insecurity: No Food Insecurity (07/16/2023)   Hunger Vital Sign    Worried About Running Out of Food in the Last Year: Never true    Ran Out of Food in the Last Year: Never true  Recent Concern: Food Insecurity - Food Insecurity Present (07/15/2023)   Hunger Vital Sign    Worried About Running Out of Food in the Last Year: Never true    Ran Out of Food in the Last Year: Often true  Transportation Needs: No Transportation Needs (07/16/2023)   PRAPARE - Administrator, Civil Service (Medical): No    Lack of Transportation (Non-Medical): No  Recent Concern: Transportation Needs - Unmet Transportation Needs (07/15/2023)   PRAPARE - Administrator, Civil Service (Medical): Yes    Lack of Transportation (Non-Medical): Yes  Physical Activity: Not on file  Stress: Not on file  Social Connections: Not on file    Hospital Course:   27 yo female admitted after drinking excessively, blacking out, and overdosing.  She has been adamant that she does not want to end her life and is motivated to stop binge drinking to prevent future issues.  The client declined medications and worked with therapy instead per her choice.  Social work has obtained an appointment for her to continue her care in outpatient.  Denies suicidal/homicidal ideations, hallucinations,  and withdrawal symptoms, no cravings.  The client has met maximum benefit of hospitalizations.  Discharge instructions provided with explanations along with crisis numbers, Rx, and follow up appointment information.  Physical Findings: AIMS:  , ,  ,  ,    CIWA:  CIWA-Ar Total: 0 COWS:     Musculoskeletal: Strength & Muscle Tone: within normal limits Gait & Station: normal Patient leans: N/A  Psychiatric Specialty Exam: Physical Exam Vitals and nursing note reviewed.  Constitutional:      Appearance: Normal appearance.  HENT:     Head:  Normocephalic.     Nose: Nose normal.  Cardiovascular:     Pulses: Normal pulses.  Musculoskeletal:        General: Normal range of motion.     Cervical back: Normal range of motion.  Neurological:     General: No focal deficit present.     Mental Status: She is oriented to person, place, and time.  Psychiatric:        Attention and Perception: Attention and perception normal.        Mood and Affect: Mood and affect normal.        Speech: Speech normal.        Behavior: Behavior normal. Behavior is cooperative.        Thought Content: Thought content normal.        Cognition and Memory: Cognition and memory normal.        Judgment: Judgment normal.     Review of Systems  Psychiatric/Behavioral:  Positive for substance abuse.   All other systems reviewed and are negative.   Blood pressure 116/77, pulse 65, temperature 97.6 F (36.4 C), resp. rate (!) 22, height 5\' 6"  (1.676 m), weight 81.2 kg, SpO2 100%.Body mass index is 28.89 kg/m.  General Appearance: Casual  Eye Contact:  Good  Speech:  Normal Rate  Volume:  Normal  Mood:  Euthymic  Affect:  Congruent  Thought Process:  Coherent and Descriptions of Associations: Intact  Orientation:  Full (Time, Place, and Person)  Thought Content:  Logical  Suicidal Thoughts:  No  Homicidal Thoughts:  No  Memory:  Immediate;   Good Recent;   Good Remote;   Good  Judgement:  Fair  Insight:  Good  Psychomotor Activity:  Normal  Concentration:  Concentration: Good and Attention Span: Good  Recall:  Good  Fund of Knowledge:  Good  Language:  Good  Akathisia:  No  Handed:  Right  AIMS (if indicated):     Assets:  Housing Intimacy Leisure Time Physical Health Resilience Social Support  ADL's:  Intact  Cognition:  WNL  Sleep:        Physical Exam: Physical Exam Vitals and nursing note reviewed.  HENT:     Head: Normocephalic.     Nose: Nose normal.  Pulmonary:     Effort: Pulmonary effort is normal.   Musculoskeletal:        General: Normal range of motion.     Cervical back: Normal range of motion.  Neurological:     General: No focal deficit present.     Mental Status: She is oriented to person, place, and time.  Psychiatric:        Attention and Perception: Attention and perception normal.        Mood and Affect: Mood and affect normal.        Speech: Speech normal.        Behavior: Behavior normal. Behavior is cooperative.  Thought Content: Thought content normal.        Cognition and Memory: Cognition and memory normal.    Review of Systems  All other systems reviewed and are negative.  Blood pressure 116/77, pulse 65, temperature 97.6 F (36.4 C), resp. rate (!) 22, height 5\' 6"  (1.676 m), weight 81.2 kg, SpO2 100%. Body mass index is 28.89 kg/m.   Social History   Tobacco Use  Smoking Status Every Day   Current packs/day: 0.50   Types: Cigarettes, Cigars  Smokeless Tobacco Never   Tobacco Cessation:  A prescription for an FDA-approved tobacco cessation medication was offered at discharge and the patient refused   Blood Alcohol level:  Lab Results  Component Value Date   ETH 285 (H) 07/14/2023   ETH 135 (H) 12/05/2018    Metabolic Disorder Labs:  No results found for: "HGBA1C", "MPG" No results found for: "PROLACTIN" No results found for: "CHOL", "TRIG", "HDL", "CHOLHDL", "VLDL", "LDLCALC"  See Psychiatric Specialty Exam and Suicide Risk Assessment completed by Attending Physician prior to discharge.  Discharge destination:  Home  Is patient on multiple antipsychotic therapies at discharge:  No   Has Patient had three or more failed trials of antipsychotic monotherapy by history:  No  Recommended Plan for Multiple Antipsychotic Therapies: NA  Discharge Instructions     Diet - low sodium heart healthy   Complete by: As directed    Discharge instructions   Complete by: As directed    Follow up with outpatient appointment   Increase  activity slowly   Complete by: As directed       Allergies as of 07/20/2023   No Known Allergies      Medication List    You have not been prescribed any medications.      Follow-up recommendations:  Activity:  as tolerated Diet:  heart healthy diet Major depressive disorder, recurrent, severe without psychosis: Declined medications Follow up with outpatient appointment   Alcohol Abuse: Recommend 12-step programs  Comments:  follow up with appointment provided by social work.  Signed: Nanine Means, NP 07/20/2023, 6:59 AM

## 2023-07-20 NOTE — Group Note (Signed)
[facility] LCSW Group Therapy Note   Group Date: 07/19/2023 Start Time: 1315 End Time: 1420   Type of Therapy/Topic:  Group Therapy:  Emotion Regulation  Participation Level:  Active    Description of Group:    The purpose of this group is to assist patients in learning to regulate negative emotions and experience positive emotions. Patients will be guided to discuss ways in which they have been vulnerable to their negative emotions. These vulnerabilities will be juxtaposed with experiences of positive emotions or situations, and patients challenged to use positive emotions to combat negative ones. Special emphasis will be placed on coping with negative emotions in conflict situations, and patients will process healthy conflict resolution skills.  Therapeutic Goals: Patient will identify two positive emotions or experiences to reflect on in order to balance out negative emotions:  Patient will label two or more emotions that they find the most difficult to experience:  Patient will be able to demonstrate positive conflict resolution skills through discussion or role plays:   Summary of Patient Progress: During discussion power dynamics, institutionalization of labels and the value that we attach to these things were discussed. Pt was present for the entirety of the group process and was actively involved in the discussion. She also has good insight into her own emotions, able to identify more than two emotions that she was holding in her body at the same time. Effective communication was also modeled. She appeared open and receptive to information from both her peers and facilitators.     Therapeutic Modalities:   Cognitive Behavioral Therapy Feelings Identification Dialectical Behavioral Therapy [facility]   Glenis Smoker, [place]

## 2023-07-20 NOTE — [facility] Suicide Risk Assessment (Signed)
[facility] INPATIENT:  Family/Significant Other Suicide Prevention Education  Suicide Prevention Education:  Patient Refusal for Family/Significant Other Suicide Prevention Education: The patient Mikayla Glover has refused to provide written consent for family/significant other to be provided Family/Significant Other Suicide Prevention Education during admission and/or prior to discharge.  Physician notified.  SPE completed with pt, as pt refused to consent to family contact. SPI pamphlet provided to pt and pt was encouraged to share information with support network, ask questions, and talk about any concerns relating to SPE. Pt denies access to guns/firearms and verbalized understanding of information provided. Mobile Crisis information also provided to pt.  Glenis Smoker 07/20/2023, 10:21 PM

## 2023-07-20 NOTE — Group Note (Signed)
Recreation Therapy Group Note   Group Topic:Leisure Education  Group Date: 07/20/2023 Start Time: 1000 End Time: 1100 Facilitators: Rosina Lowenstein, LRT, CTRS Location:  Craft Room  Group Description: Leisure. Patients were given the option to choose from coloring, singing karaoke, journaling, or playing cards. LRT and pts discussed the meaning of leisure, the importance of participating in leisure during their free time/when they're outside of the hospital, as well as how our leisure interests can also serve as coping skills. Pt identified two leisure interests and shared with the group.   Goal Area(s) Addressed:  Patient will identify a current leisure interest.  Patient will learn the definition of "leisure". Patient will practice making a positive decision. Patient will have the opportunity to try a new leisure activity. Patient will communicate with peers and LRT.    Affect/Mood: Appropriate   Participation Level: Active and Engaged   Participation Quality: Independent   Behavior: Appropriate, Calm, and Cooperative   Speech/Thought Process: Coherent   Insight: Good   Judgement: Good   Modes of Intervention: Activity   Patient Response to Interventions:  Attentive, Engaged, Interested , and Receptive   Education Outcome:  Acknowledges education   Clinical Observations/Individualized Feedback: Mikayla Glover was active in their participation of session activities and group discussion. Pt identified "go swimming or listen to music" as things she does in her free time. Pt chose to draw and sing karaoke while in group. Pt interacted well with LRT and peers duration of session.   Plan: Continue to engage patient in RT group sessions 2-3x/week.   Rosina Lowenstein, LRT, CTRS 07/20/2023 11:38 AM

## 2023-07-20 NOTE — Group Note (Signed)
Date:  07/20/2023 Time:  11:05 AM  Group Topic/Focus:  Building Self Esteem:   The Focus of this group is helping patients become aware of the effects of self-esteem on their lives, the things they and others do that enhance or undermine their self-esteem, seeing the relationship between their level of self-esteem and the choices they make and learning ways to enhance self-esteem. Coping With Mental Health Crisis:   The purpose of this group is to help patients identify strategies for coping with mental health crisis.  Group discusses possible causes of crisis and ways to manage them effectively. Goals Group:   The focus of this group is to help patients establish daily goals to achieve during treatment and discuss how the patient can incorporate goal setting into their daily lives to aide in recovery.    Participation Level:  Active  Participation Quality:  Appropriate  Affect:  Appropriate  Cognitive:  Appropriate  Insight: Good  Engagement in Group:  Engaged  Modes of Intervention:  Activity  Additional Comments:    Zafir Schauer 07/20/2023, 11:05 AM

## 2023-07-20 NOTE — Progress Notes (Signed)
Pt left unit with all belongings,escorted by staff. Pt is future oriented and denies s/I or h/I .

## 2023-07-20 NOTE — [facility] Suicide Risk Assessment (Addendum)
[facility] Discharge Suicide Risk Assessment   Principal Problem: Major depressive disorder, recurrent severe without psychotic features (HCC) Discharge Diagnoses: Principal Problem:   Major depressive disorder, recurrent severe without psychotic features (HCC) Active Problems:   Acetaminophen overdose   Alcohol abuse with intoxication (HCC)   Suicidal overdose, initial encounter (HCC)   Alcohol dependence (HCC)   Total Time spent with patient: 45 minutes  Musculoskeletal: Strength & Muscle Tone: within normal limits Gait & Station: normal Patient leans: N/A  Psychiatric Specialty Exam: Physical Exam Vitals and nursing note reviewed.  Constitutional:      Appearance: Normal appearance.  HENT:     Head: Normocephalic.     Nose: Nose normal.  Cardiovascular:     Pulses: Normal pulses.  Musculoskeletal:        General: Normal range of motion.     Cervical back: Normal range of motion.  Neurological:     General: No focal deficit present.     Mental Status: She is oriented to person, place, and time.  Psychiatric:        Attention and Perception: Attention and perception normal.        Mood and Affect: Mood and affect normal.        Speech: Speech normal.        Behavior: Behavior normal. Behavior is cooperative.        Thought Content: Thought content normal.        Cognition and Memory: Cognition and memory normal.        Judgment: Judgment normal.     Review of Systems  Psychiatric/Behavioral:  Positive for substance abuse.   All other systems reviewed and are negative.   Blood pressure 116/77, pulse 65, temperature 97.6 F (36.4 C), resp. rate (!) 22, height 5\' 6"  (1.676 m), weight 81.2 kg, SpO2 100%.Body mass index is 28.89 kg/m.  General Appearance: Casual  Eye Contact:  Good  Speech:  Normal Rate  Volume:  Normal  Mood:  Euthymic  Affect:  Congruent  Thought Process:  Coherent and Descriptions of Associations: Intact  Orientation:  Full (Time, Place, and  Person)  Thought Content:  Logical  Suicidal Thoughts:  No  Homicidal Thoughts:  No  Memory:  Immediate;   Good Recent;   Good Remote;   Good  Judgement:  Fair  Insight:  Good  Psychomotor Activity:  Normal  Concentration:  Concentration: Good and Attention Span: Good  Recall:  Good  Fund of Knowledge:  Good  Language:  Good  Akathisia:  No  Handed:  Right  AIMS (if indicated):     Assets:  Housing Intimacy Leisure Time Physical Health Resilience Social Support  ADL's:  Intact  Cognition:  WNL  Sleep:        Physical Exam: Physical Exam Vitals and nursing note reviewed.  Constitutional:      Appearance: Normal appearance.  HENT:     Head: Normocephalic.     Nose: Nose normal.  Cardiovascular:     Pulses: Normal pulses.  Musculoskeletal:        General: Normal range of motion.     Cervical back: Normal range of motion.  Neurological:     General: No focal deficit present.     Mental Status: She is oriented to person, place, and time.  Psychiatric:        Attention and Perception: Attention and perception normal.        Mood and Affect: Mood and affect normal.  Speech: Speech normal.        Behavior: Behavior normal. Behavior is cooperative.        Thought Content: Thought content normal.        Cognition and Memory: Cognition and memory normal.        Judgment: Judgment normal.    Review of Systems  Psychiatric/Behavioral:  Positive for substance abuse.   All other systems reviewed and are negative.  Blood pressure 116/77, pulse 65, temperature 97.6 F (36.4 C), resp. rate (!) 22, height 5\' 6"  (1.676 m), weight 81.2 kg, SpO2 100%. Body mass index is 28.89 kg/m.  Mental Status Per Nursing Assessment::   On Admission:  Suicidal ideation indicated by patient  Demographic Factors:  Adolescent or young adult and Gay, lesbian, or bisexual orientation  Loss Factors: NA  Historical Factors: Impulsivity  Risk Reduction Factors:   Sense of  responsibility to family, Living with another person, especially a relative, Positive social support, and Positive coping skills or problem solving skills  Continued Clinical Symptoms:  None  Cognitive Features That Contribute To Risk:  None    Suicide Risk:  Minimal: No identifiable suicidal ideation.  Patients presenting with no risk factors but with morbid ruminations; may be classified as minimal risk based on the severity of the depressive symptoms  Plan Of Care/Follow-up recommendations:  Activity:  as tolerated Diet:  heart healthy  Major depressive disorder, recurrent, severe without psychosis: Declined medications Follow up with outpatient appointment   Alcohol Abuse: Recommend 12-step programs  Nanine Means, NP 07/20/2023, 6:51 AM

## 2023-07-20 NOTE — Plan of Care (Signed)
D- Patient alert and oriented. Affect/mood. Denies SI, HI, AVH, and pain. Pt looking forward to discharge Pt is future oriented and looking forward to going home A- Scheduled medications administered to patient, per MD orders. Support and encouragement provided.  Routine safety checks conducted every 15 minutes.  Patient informed to notify staff with problems or concerns. R- No adverse drug reactions noted. Patient contracts for safety at this time. Patient compliant with medications and treatment plan. Patient receptive, calm, and cooperative. Patient interacts well with others on the unit.  Patient remains safe at this time.

## 2024-01-26 ENCOUNTER — Other Ambulatory Visit: Payer: Self-pay

## 2024-01-26 ENCOUNTER — Emergency Department (HOSPITAL_BASED_OUTPATIENT_CLINIC_OR_DEPARTMENT_OTHER)
Admission: EM | Admit: 2024-01-26 | Discharge: 2024-01-26 | Disposition: A | Discharge status: Home / Self Care | Payer: 59 | Attending: Emergency Medicine | Admitting: Emergency Medicine

## 2024-01-26 ENCOUNTER — Encounter (HOSPITAL_BASED_OUTPATIENT_CLINIC_OR_DEPARTMENT_OTHER): Payer: Self-pay | Admitting: *Deleted

## 2024-01-26 DIAGNOSIS — U071 COVID-19: Secondary | ICD-10-CM | POA: Diagnosis not present

## 2024-01-26 DIAGNOSIS — Z87891 Personal history of nicotine dependence: Secondary | ICD-10-CM | POA: Insufficient documentation

## 2024-01-26 DIAGNOSIS — R112 Nausea with vomiting, unspecified: Secondary | ICD-10-CM | POA: Diagnosis present

## 2024-01-26 LAB — RESP PANEL BY RT-PCR (RSV, FLU A&B, COVID)  RVPGX2
Influenza A by PCR: NEGATIVE
Influenza B by PCR: NEGATIVE
Resp Syncytial Virus by PCR: NEGATIVE
SARS Coronavirus 2 by RT PCR: POSITIVE — AB

## 2024-01-26 MED ORDER — IBUPROFEN 800 MG PO TABS
800.0000 mg | ORAL_TABLET | Freq: Once | ORAL | Status: AC
  Administered 2024-01-26: 800 mg via ORAL
  Filled 2024-01-26: qty 1

## 2024-01-26 NOTE — ED Notes (Signed)
 Pt was given Pedialyte, po intake encouraged

## 2024-01-26 NOTE — ED Provider Notes (Signed)
 [place] [facility] Provider Note   CSN: [id number] Arrival date & time: 01/26/24  2128     History  Chief Complaint  Patient presents with   URI    Mikayla Glover is a 28 y.o. female.  Patient here with bodyaches fever chills for the last 2 days.  Nothing makes it worse or better.  Smoking history.  Denies any major cough sputum production nausea vomiting diarrhea.  No obvious sick contacts.  Feeling better after ibuprofen in triage.  Denies shortness of breath chest pain.  The history is provided by the patient.       Home Medications Prior to Admission medications   Not on File      Allergies    Patient has no known allergies.    Review of Systems   Review of Systems  Physical Exam Updated Vital Signs BP (!) 136/92 (BP Location: Left Arm)   Pulse (!) 104   Temp (!) 100.7 F (38.2 C) (Oral)   Resp 16   LMP 01/01/2024   SpO2 99%  Physical Exam Vitals and nursing note reviewed.  Constitutional:      General: She is not in acute distress.    Appearance: She is well-developed. She is not ill-appearing.  HENT:     Head: Normocephalic and atraumatic.     Nose: Nose normal.     Mouth/Throat:     Mouth: Mucous membranes are moist.  Eyes:     Extraocular Movements: Extraocular movements intact.     Conjunctiva/sclera: Conjunctivae normal.     Pupils: Pupils are equal, round, and reactive to light.  Cardiovascular:     Rate and Rhythm: Normal rate and regular rhythm.     Pulses: Normal pulses.     Heart sounds: Normal heart sounds. No murmur heard. Pulmonary:     Effort: Pulmonary effort is normal. No respiratory distress.     Breath sounds: Normal breath sounds.  Abdominal:     General: Abdomen is flat.     Palpations: Abdomen is soft.     Tenderness: There is no abdominal tenderness.  Musculoskeletal:        General: No swelling.     Cervical back: Normal range of motion and neck supple.  Skin:    General: Skin is  warm and dry.     Capillary Refill: Capillary refill takes less than 2 seconds.  Neurological:     General: No focal deficit present.     Mental Status: She is alert and oriented to person, place, and time.     Cranial Nerves: No cranial nerve deficit.     Sensory: No sensory deficit.     Motor: No weakness.     Coordination: Coordination normal.  Psychiatric:        Mood and Affect: Mood normal.     ED Results / Procedures / Treatments   Labs (all labs ordered are listed, but only abnormal results are displayed) Labs Reviewed  RESP PANEL BY RT-PCR (RSV, FLU A&B, COVID)  [id number] - Abnormal; Notable for the following components:      Result Value   SARS Coronavirus 2 by RT PCR POSITIVE (*)    All other components within normal limits    EKG None  Radiology No results found.  Procedures Procedures    Medications Ordered in ED Medications  ibuprofen (ADVIL) tablet 800 mg (800 mg Oral Given 01/26/24 2146)    ED Course/ Medical Decision Making/ A&P  Medical Decision Making Risk Prescription drug management.   [name] is here with flulike symptoms.  Febrile but otherwise unremarkable vitals.  Patient given ibuprofen in triage feeling better.  Clear breath sounds.  No signs of any respiratory distress.  She denies any cough sputum production chest pain.  Symptoms for about 2 to 3 days.  COVID flu RSV swab obtained as differential diagnosis likely viral process.  Have no concern for pneumonia or other acute process at this time.  COVID test is positive.  Recommend continued use of Tylenol ibuprofen hydration and rest.  Understands return precautions.  Discharged in good condition.  This chart was dictated using voice recognition software.  Despite best efforts to proofread,  errors can occur which can change the documentation meaning.         Final Clinical Impression(s) / ED Diagnoses Final diagnoses:  COVID-19    Rx / DC  Orders ED Discharge Orders     None         Virgina Norfolk, DO 01/26/24 2247

## 2024-01-26 NOTE — ED Triage Notes (Signed)
 Pt has been having body aches, fatigue, cough, chills and headache

## 2024-01-26 NOTE — Discharge Instructions (Signed)
 Continue Tylenol 1000 mg every 6 hours as needed for fever.  Continue 400 mg ibuprofen every 8 hours as needed for fever.

## 2024-05-22 ENCOUNTER — Other Ambulatory Visit (HOSPITAL_BASED_OUTPATIENT_CLINIC_OR_DEPARTMENT_OTHER): Payer: Self-pay
